# Patient Record
Sex: Female | Born: 1975 | Race: White | Hispanic: No | Marital: Single | Smoking: Former smoker
Health system: Southern US, Community
[De-identification: ages and names within clinical notes are randomized; demographics above are authoritative.]

## PROBLEM LIST (undated history)

## (undated) DIAGNOSIS — J45909 Unspecified asthma, uncomplicated: Secondary | ICD-10-CM

## (undated) DIAGNOSIS — J309 Allergic rhinitis, unspecified: Secondary | ICD-10-CM

## (undated) HISTORY — PX: CHOLECYSTECTOMY: SHX55

## (undated) HISTORY — DX: Allergic rhinitis, unspecified: J30.9

## (undated) HISTORY — PX: NASAL SINUS SURGERY: SHX719

## (undated) HISTORY — PX: NASAL SEPTUM SURGERY: SHX37

## (undated) HISTORY — DX: Unspecified asthma, uncomplicated: J45.909

---

## 2004-11-20 ENCOUNTER — Inpatient Hospital Stay (HOSPITAL_COMMUNITY): Admission: AD | Admit: 2004-11-20 | Discharge: 2004-11-22 | Payer: Self-pay | Admitting: General Surgery

## 2011-06-15 ENCOUNTER — Other Ambulatory Visit (HOSPITAL_COMMUNITY): Payer: Self-pay | Admitting: Obstetrics and Gynecology

## 2011-06-15 DIAGNOSIS — N971 Female infertility of tubal origin: Secondary | ICD-10-CM

## 2011-07-07 ENCOUNTER — Ambulatory Visit (INDEPENDENT_AMBULATORY_CARE_PROVIDER_SITE_OTHER): Payer: PRIVATE HEALTH INSURANCE | Admitting: Family Medicine

## 2011-07-07 VITALS — BP 120/76 | HR 78 | Temp 98.0°F | Resp 16 | Ht 66.0 in | Wt 211.0 lb

## 2011-07-07 DIAGNOSIS — R059 Cough, unspecified: Secondary | ICD-10-CM

## 2011-07-07 DIAGNOSIS — J0110 Acute frontal sinusitis, unspecified: Secondary | ICD-10-CM

## 2011-07-07 DIAGNOSIS — J45901 Unspecified asthma with (acute) exacerbation: Secondary | ICD-10-CM

## 2011-07-07 DIAGNOSIS — R05 Cough: Secondary | ICD-10-CM

## 2011-07-07 DIAGNOSIS — J329 Chronic sinusitis, unspecified: Secondary | ICD-10-CM

## 2011-07-07 DIAGNOSIS — J302 Other seasonal allergic rhinitis: Secondary | ICD-10-CM

## 2011-07-07 MED ORDER — ALBUTEROL SULFATE HFA 108 (90 BASE) MCG/ACT IN AERS: 2.0000 | INHALATION_SPRAY | Freq: Four times a day (QID) | RESPIRATORY_TRACT | Status: DC | PRN

## 2011-07-07 MED ORDER — PREDNISONE 20 MG PO TABS: ORAL_TABLET | ORAL | Status: DC

## 2011-07-07 MED ORDER — FLUTICASONE PROPIONATE 50 MCG/ACT NA SUSP: 2.0000 | Freq: Every day | NASAL | Status: DC

## 2011-07-07 MED ORDER — AZITHROMYCIN 250 MG PO TABS: ORAL_TABLET | ORAL | Status: AC

## 2011-07-07 MED ORDER — HYDROCODONE-HOMATROPINE 5-1.5 MG/5ML PO SYRP: 5.0000 mL | ORAL_SOLUTION | Freq: Three times a day (TID) | ORAL | Status: AC | PRN

## 2011-07-07 NOTE — Progress Notes (Signed)
  Subjective:    Patient ID: Chelsea Bryant, female    DOB: 1976/01/10, 36 y.o.   MRN: [id number]  Cough This is a new problem. The current episode started 1 to 4 weeks ago. The problem has been gradually worsening.   Cough worse at night; rarely productive of purulent sputum Wheezing  Advair BID  Albuterol daily  Denies fever or chills; Denies facial or dental pain  Quit smoking 1 year ago Review of Systems  Respiratory: Positive for cough.    PMH/ Esure placed 2/24; Dr. Renaldo Fiddler    Objective:   Physical Exam  Constitutional: She appears well-developed.  HENT:  Right Ear: External ear normal.  Left Ear: External ear normal.  Nose: Mucosal edema and rhinorrhea present.  Neck: Neck supple.  Cardiovascular: Normal rate, regular rhythm and normal heart sounds.   Pulmonary/Chest: She has wheezes (wheezing anterior chest).  Lymphadenopathy:    She has cervical adenopathy.  Skin: Skin is warm.          Assessment & Plan:   1. Sinusitis  azithromycin (ZITHROMAX) 250 MG tablet, HYDROcodone-homatropine (HYCODAN) 5-1.5 MG/5ML syrup  2. Asthma  albuterol (PROVENTIL HFA;VENTOLIN HFA) 108 (90 BASE) MCG/ACT inhaler, predniSONE (DELTASONE) 20 MG tablet  3. Seasonal allergies  fluticasone (FLONASE) 50 MCG/ACT nasal spray

## 2011-07-20 ENCOUNTER — Ambulatory Visit (INDEPENDENT_AMBULATORY_CARE_PROVIDER_SITE_OTHER): Payer: PRIVATE HEALTH INSURANCE | Admitting: Family Medicine

## 2011-07-20 ENCOUNTER — Ambulatory Visit: Payer: PRIVATE HEALTH INSURANCE

## 2011-07-20 VITALS — BP 127/79 | HR 93 | Resp 20

## 2011-07-20 DIAGNOSIS — J45909 Unspecified asthma, uncomplicated: Secondary | ICD-10-CM

## 2011-07-20 DIAGNOSIS — R062 Wheezing: Secondary | ICD-10-CM

## 2011-07-20 DIAGNOSIS — R05 Cough: Secondary | ICD-10-CM

## 2011-07-20 DIAGNOSIS — R059 Cough, unspecified: Secondary | ICD-10-CM

## 2011-07-20 MED ORDER — CEFDINIR 300 MG PO CAPS: 300.0000 mg | ORAL_CAPSULE | Freq: Two times a day (BID) | ORAL | Status: AC

## 2011-07-20 MED ORDER — PREDNISONE 20 MG PO TABS: ORAL_TABLET | ORAL | Status: DC

## 2011-07-20 NOTE — Progress Notes (Signed)
Patient Name: Chelsea Bryant Date of Birth: November 09, 1975 Medical Record Number: [id number] Gender: female Date of Encounter: 07/20/2011  History of Present Illness:  Chelsea Bryant is a 36 y.o. very pleasant female patient who presents with the following:  See OV 07/07/11- she was treated with azithro and prednisone and started on flonase for cough and asthma/ AR exacerbation.  Kim felt that she got better, but then her cough got worse a week ago again. Last night her cough got a lot worse- caused her to" lose her breath".  She felt that her "throat would close" when she coughed.  However, cough is non- productive.  Felt like she was going to "cough up a lung."  She was able to get her cough under control and eventually went to sleep.  She has noted a lot of wheezing lately.   No vomiting. No fever She really feels ok except she is coughing so badly. Also she noted that she broke out in a rash just one her chest about one week ago- unsure if it could be due to the azithromycin or not.  It is not pruritic.     She was brought back urgently and treated with an albuterol/ atrovent neb today.  The neb made her breathing feel better but did make her jittery as well  She has had essure procedure for pregnancy prevention.   tdap 2007  There is no problem list on file for this patient.  No past medical history on file. No past surgical history on file. History  Substance Use Topics  . Smoking status: Former Games developer  . Smokeless tobacco: Not on file  . Alcohol Use: Not on file   No family history on file. Allergies  Allergen Reactions  . Metronidazole Hives    Medication list has been reviewed and updated.  Review of Systems: As per HPI- otherwise negative. Also fine but non- itchy rash on her chest only for the last 5 days  Physical Examination: Filed Vitals:   07/20/11 0812  BP: 127/79  Pulse: 93  Resp: 20  SpO2: 98%    There is no height or weight on file to calculate  BMI.  GEN: WDWN, NAD, Non-toxic, A & O x 3 HEENT: Atraumatic, Normocephalic. Neck supple. No masses, No LAD. Ears and Nose: No external deformity. CV: RRR, No M/G/R. No JVD. No thrill. No extra heart sounds. PULM: good air movement bilaterally- there are expiratory wheezes.   After neb treatment breathing much quieter- no respiratory distress or retractions EXTR: No c/c/e NEURO Normal gait.  PSYCH: Normally interactive. Conversant. Not depressed or anxious appearing.  Calm demeanor.  Fine rash on chest only  [facility] reading (PRIMARY) by  Dr. Patsy Lager.  Negative chest  Clinical Data: Persistent cough 1 month.  CHEST - 2 VIEW  Comparison: None.  Findings: Right lung is clear. There is focal opacity at the left hemidiaphragm, compatible with atelectasis or infiltrate. The lungs are clear without focal consolidation, edema, effusion or pneumothorax. Cardiopericardial silhouette is within normal limits for size. Imaged bony structures of the thorax are intact. Imaged bony structures of the thorax are intact.  IMPRESSION: Small focus of atelectasis or infiltrate at the left base.  Assessment and Plan: 1. Cough  DG Chest 2 View, Bordetella pertussis antibody, cefdinir (OMNICEF) 300 MG capsule  2. Wheeze  DG Chest 2 View, Bordetella pertussis antibody  3. Asthma  predniSONE (DELTASONE) 20 MG tablet   Will test for pertussis due to severity and duration  of cough.  Will cover with omnicef in response to possible pneumonia- also prednisone for severe cough and asthma exacerbation.  Rash may be related to azithromycin, or may be unrelated.  However, no sign of severe allergic reaction.  Please call us if not feeling better in the next few days.  If she get "scared" due to SOB and severe cough as she did last night- don't hesitate to seek care at ED if needed.

## 2011-07-21 ENCOUNTER — Encounter: Payer: Self-pay | Admitting: Family Medicine

## 2011-07-21 MED ORDER — ALBUTEROL SULFATE (2.5 MG/3ML) 0.083% IN NEBU
2.5000 mg | INHALATION_SOLUTION | RESPIRATORY_TRACT | Status: DC | PRN
  Administered 2012-06-30: 2.5 mg via RESPIRATORY_TRACT

## 2011-07-21 MED ORDER — IPRATROPIUM BROMIDE 0.02 % IN SOLN
0.5000 mg | RESPIRATORY_TRACT | Status: DC | PRN
  Administered 2012-06-30: 0.5 mg via RESPIRATORY_TRACT

## 2011-07-21 NOTE — Progress Notes (Signed)
Addended by: Abbe Amsterdam C on: 07/21/2011 01:57 PM   Modules accepted: Orders

## 2011-07-24 LAB — BORDETELLA PERTUSSIS ANTIBODY
B pertussis IgA Ab, Quant: 2.5 U/mL — ABNORMAL HIGH (ref <=1.1)
B pertussis IgG Ab, Quant: 8.7 U/mL — ABNORMAL HIGH (ref <=2.4)
B pertussis IgM Ab, Quant: 1 U/mL (ref <=1.1)

## 2011-07-25 LAB — BORDETELLA PERTUSSIS, IGA BY IB

## 2011-07-25 LAB — BORDETELLA PERTUSSIS, IGG BY IB (RFLX)

## 2011-08-06 ENCOUNTER — Encounter: Payer: Self-pay | Admitting: Family Medicine

## 2011-09-10 ENCOUNTER — Ambulatory Visit (HOSPITAL_COMMUNITY): Payer: PRIVATE HEALTH INSURANCE

## 2011-09-14 ENCOUNTER — Ambulatory Visit (INDEPENDENT_AMBULATORY_CARE_PROVIDER_SITE_OTHER): Payer: PRIVATE HEALTH INSURANCE | Admitting: Family Medicine

## 2011-09-14 ENCOUNTER — Ambulatory Visit: Payer: PRIVATE HEALTH INSURANCE

## 2011-09-14 VITALS — BP 121/72 | HR 77 | Temp 98.4°F | Resp 18 | Ht 66.5 in | Wt 214.0 lb

## 2011-09-14 DIAGNOSIS — J9801 Acute bronchospasm: Secondary | ICD-10-CM

## 2011-09-14 DIAGNOSIS — R05 Cough: Secondary | ICD-10-CM

## 2011-09-14 DIAGNOSIS — R062 Wheezing: Secondary | ICD-10-CM

## 2011-09-14 DIAGNOSIS — Z23 Encounter for immunization: Secondary | ICD-10-CM

## 2011-09-14 DIAGNOSIS — R059 Cough, unspecified: Secondary | ICD-10-CM

## 2011-09-14 DIAGNOSIS — J40 Bronchitis, not specified as acute or chronic: Secondary | ICD-10-CM

## 2011-09-14 MED ORDER — HYDROCODONE-HOMATROPINE 5-1.5 MG/5ML PO SYRP: ORAL_SOLUTION | ORAL | Status: AC

## 2011-09-14 MED ORDER — PREDNISONE 20 MG PO TABS: ORAL_TABLET | ORAL | Status: AC

## 2011-09-14 MED ORDER — IPRATROPIUM BROMIDE 0.02 % IN SOLN
0.5000 mg | Freq: Once | RESPIRATORY_TRACT | Status: AC
  Administered 2011-09-14: 0.5 mg via RESPIRATORY_TRACT

## 2011-09-14 MED ORDER — ALBUTEROL SULFATE (2.5 MG/3ML) 0.083% IN NEBU
2.5000 mg | INHALATION_SOLUTION | Freq: Once | RESPIRATORY_TRACT | Status: AC
  Administered 2011-09-14: 2.5 mg via RESPIRATORY_TRACT

## 2011-09-14 MED ORDER — AZITHROMYCIN 250 MG PO TABS: ORAL_TABLET | ORAL | Status: AC

## 2011-09-14 MED ORDER — TETANUS-DIPHTH-ACELL PERTUSSIS 5-2.5-18.5 LF-MCG/0.5 IM SUSP
0.5000 mL | Freq: Once | INTRAMUSCULAR | Status: AC
  Administered 2011-09-14: 0.5 mL via INTRAMUSCULAR

## 2011-09-14 MED ORDER — BENZONATATE 100 MG PO CAPS: 100.0000 mg | ORAL_CAPSULE | Freq: Three times a day (TID) | ORAL | Status: AC | PRN

## 2011-09-14 NOTE — Progress Notes (Signed)
Patient ID: Chelsea Bryant MRN: [id number], DOB: Jun 27, 1975, 36 y.o. Date of Encounter: 09/14/2011, 6:14 PM  Primary Physician: No primary provider on file.  Chief Complaint:  Chief Complaint  Patient presents with  . Cough    x 3 days    HPI: 36 y.o. year old female with recent bout of whooping cough several months prior presents with a 3 day history of mild nasal congestion, rhinorrhea, and cough. Afebrile. No chills. Began as cough with rhinorrhea that has now transitioned into mild nasal congestion. Cough is nonproductive and not associated with time of day. Will get into coughing fits that cause some wheezing and SOB. Has been using her albuterol inhaler with minimal success. No GI complaints. Appetite decreased slightly. Has not yet gotten her TDaP. Does not need a refill of her albuterol inhaler.  No sick contacts, recent antibiotics, or recent travels.   No leg trauma, sedentary periods, h/o cancer, or tobacco use.  Past Medical History  Diagnosis Date  . Whooping cough      Home Meds: Prior to Admission medications   Medication Sig Start Date End Date Taking? Authorizing Provider  albuterol (PROVENTIL HFA;VENTOLIN HFA) 108 (90 BASE) MCG/ACT inhaler Inhale 2 puffs into the lungs every 6 (six) hours as needed. 07/07/11  Yes Dois Davenport, MD  escitalopram (LEXAPRO) 10 MG tablet Take 10 mg by mouth daily.   Yes Historical Provider, MD  fluticasone (FLONASE) 50 MCG/ACT nasal spray Place 2 sprays into the nose daily. 07/07/11 07/06/12 Yes Dois Davenport, MD  fluticasone-salmeterol (ADVAIR HFA) [phone] MCG/ACT inhaler Inhale 2 puffs into the lungs 2 (two) times daily.    Historical Provider, MD  predniSONE (DELTASONE) 20 MG tablet 2 daily for 3 days then 1 daily for 3 days 07/20/11  No Pearline Cables, MD    Allergies:  Allergies  Allergen Reactions  . Metronidazole Hives    History   Social History  . Marital Status: Single    Spouse Name: N/A    Number of  Children: N/A  . Years of Education: N/A   Occupational History  . Not on file.   Social History Main Topics  . Smoking status: Former Games developer  . Smokeless tobacco: Not on file  . Alcohol Use: Not on file  . Drug Use: Not on file  . Sexually Active: Not on file   Other Topics Concern  . Not on file   Social History Narrative  . No narrative on file     Review of Systems: Constitutional: negative for chills, fever, night sweats or weight changes Cardiovascular: negative for chest pain or palpitations Respiratory: negative for hemoptysis Abdominal: negative for abdominal pain, nausea, vomiting or diarrhea Dermatological: negative for rash Neurologic: negative for headache   Physical Exam: Blood pressure 121/72, pulse 77, temperature 98.4 F (36.9 C), temperature source Oral, resp. rate 18, height 5' 6.5" (1.689 m), weight 214 lb (97.07 kg)., Body mass index is 34.02 kg/(m^2). General: Well developed, well nourished, in no acute distress. Head: Normocephalic, atraumatic, eyes without discharge, sclera non-icteric, nares are congested. Bilateral auditory canals clear, TM's are without perforation, pearly grey with reflective cone of light bilaterally. No sinus TTP. Oral cavity moist, dentition normal. Posterior pharynx with post nasal drip and mild erythema. No peritonsillar abscess or tonsillar exudate. Neck: Supple. No thyromegaly. Full ROM. No lymphadenopathy. Lungs: Mild expiratory wheezes bilaterally without rales or rhonchi. Breathing is unlabored.  Heart: RRR with S1 S2. No murmurs, rubs, or gallops  appreciated. Msk:  Strength and tone normal for age. Extremities: No clubbing or cyanosis. No edema. Neuro: Alert and oriented X 3. Moves all extremities spontaneously. CNII-XII grossly in tact. Psych:  Responds to questions appropriately with a normal affect.   [facility] reading (PRIMARY) by  Dr. Patsy Lager. negative  S/P Albuterol and Atrovent neb: Patient feels much better.  Decreased wheezing to auscultation  ASSESSMENT AND PLAN:  36 y.o. year old female with bronchitis worsened by RAD. -Azithromycin 250 MG #6 2 po first day then 1 po next 4 days no RF -Hycodan #4oz 1 tsp po q 4-6 hours prn cough no RF SED -Tessalon Perles 100 mg 1 po tid prn cough #30 no RF  -TDaP -Mucinex -Tylenol/Motrin prn -Rest/fluids -RTC precautions -RTC 3-5 days if no improvement  Elinor Dodge, PA-C 09/14/2011 6:14 PM

## 2012-02-02 ENCOUNTER — Other Ambulatory Visit: Payer: Self-pay | Admitting: Family Medicine

## 2012-02-02 ENCOUNTER — Telehealth: Payer: Self-pay | Admitting: *Deleted

## 2012-02-02 MED ORDER — FLUTICASONE-SALMETEROL 45-21 MCG/ACT IN AERO: 1.0000 | INHALATION_SPRAY | Freq: Two times a day (BID) | RESPIRATORY_TRACT | Status: DC

## 2012-02-02 MED ORDER — ALBUTEROL SULFATE HFA 108 (90 BASE) MCG/ACT IN AERS: 2.0000 | INHALATION_SPRAY | Freq: Four times a day (QID) | RESPIRATORY_TRACT | Status: DC | PRN

## 2012-02-02 NOTE — Telephone Encounter (Signed)
Needs rx for ProAir.   Chart is at nurses station for refill on it for Avair from pharmacy

## 2012-02-02 NOTE — Telephone Encounter (Signed)
I will refill each once. She will need an office visit before she is out. We will not be able to refill them any further without an office visit.

## 2012-06-30 ENCOUNTER — Ambulatory Visit (INDEPENDENT_AMBULATORY_CARE_PROVIDER_SITE_OTHER): Payer: Managed Care, Other (non HMO) | Admitting: Emergency Medicine

## 2012-06-30 ENCOUNTER — Ambulatory Visit: Payer: Managed Care, Other (non HMO)

## 2012-06-30 VITALS — BP 119/79 | HR 78 | Temp 98.2°F | Resp 16 | Ht 67.0 in | Wt 222.6 lb

## 2012-06-30 DIAGNOSIS — R062 Wheezing: Secondary | ICD-10-CM

## 2012-06-30 DIAGNOSIS — J45909 Unspecified asthma, uncomplicated: Secondary | ICD-10-CM

## 2012-06-30 DIAGNOSIS — R059 Cough, unspecified: Secondary | ICD-10-CM

## 2012-06-30 DIAGNOSIS — R05 Cough: Secondary | ICD-10-CM

## 2012-06-30 MED ORDER — IPRATROPIUM BROMIDE 0.02 % IN SOLN: 0.5000 mg | Freq: Once | RESPIRATORY_TRACT | Status: DC

## 2012-06-30 MED ORDER — ALBUTEROL SULFATE (2.5 MG/3ML) 0.083% IN NEBU: 2.5000 mg | INHALATION_SOLUTION | Freq: Once | RESPIRATORY_TRACT | Status: DC

## 2012-06-30 MED ORDER — PREDNISONE 20 MG PO TABS: ORAL_TABLET | ORAL | Status: DC

## 2012-06-30 MED ORDER — FLUTICASONE-SALMETEROL 500-50 MCG/DOSE IN AEPB: 1.0000 | INHALATION_SPRAY | Freq: Two times a day (BID) | RESPIRATORY_TRACT | Status: DC

## 2012-06-30 MED ORDER — AZITHROMYCIN 250 MG PO TABS: ORAL_TABLET | ORAL | Status: DC

## 2012-06-30 NOTE — Progress Notes (Signed)
  Subjective:    Patient ID: Chelsea Bryant, female    DOB: [birth year], 37 y.o.   MRN: [id number]  HPI 37 year old female presents with acute asthma exacerbation. States she was working outside 2 days ago and her allergies flared causing her to have worsening wheezing, chest tightness, and SOB.  Also complains of cough that is dry and nonproductive.  Has had minimal nasal congestion. Denies sore throat, sinus pain, otalgia, nausea, vomiting, or abdominal pain.  Has been without her Advair for about 2 weeks secondary to cost.  Her current insurance plan does not have any prescription benefits and the Advair cost about $200.  She has been using her ProAir more frequently than she usually does and it has helped slightly but symptoms have persisted.     Review of Systems  Constitutional: Negative for fever and chills.  HENT: Positive for rhinorrhea. Negative for congestion, sore throat and postnasal drip.   Respiratory: Positive for cough, chest tightness, shortness of breath and wheezing.   Cardiovascular: Negative for chest pain.  Gastrointestinal: Negative for nausea, vomiting and abdominal pain.  Allergic/Immunologic: Positive for environmental allergies.       Objective:   Physical Exam  Constitutional: She is oriented to person, place, and time. She appears well-developed and well-nourished.  HENT:  Head: Normocephalic and atraumatic.  Right Ear: Hearing, tympanic membrane, external ear and ear canal normal.  Left Ear: Hearing, tympanic membrane, external ear and ear canal normal.  Mouth/Throat: Uvula is midline, oropharynx is clear and moist and mucous membranes are normal. No oropharyngeal exudate.  Eyes: Conjunctivae are normal.  Neck: Normal range of motion.  Cardiovascular: Normal rate, regular rhythm and normal heart sounds.   Pulmonary/Chest: Effort normal. She has wheezes.  Wheezing improved s/p duoneb  Lymphadenopathy:    She has no cervical adenopathy.  Neurological:  She is alert and oriented to person, place, and time.  Psychiatric: She has a normal mood and affect. Her behavior is normal. Judgment and thought content normal.    Patient reports improvement in symptoms s/p nebulizer.   [facility] reading (PRIMARY) by  Dr. Cleta Alberts as no acute infiltrate or consolidation noted.       Assessment & Plan:  1. Wheezing - Plan: albuterol (PROVENTIL) (2.5 MG/3ML) 0.083% nebulizer solution 2.5 mg, ipratropium (ATROVENT) nebulizer solution 0.5 mg, DG Chest 2 View, predniSONE (DELTASONE) 20 MG tablet  -Continue ProAir q4-6 hours as needed for wheezing and SOB  -I have given her a coupon for Advair, hopefully this will help out with the cost  -Prednisone taper 2. Cough - Plan: azithromycin (ZITHROMAX) 250 MG tablet  -Will go ahead and cover with a Zpack due to increased risk of bronchitis/pneumonia.  3. Reactive airway disease  -See above  -Follow up if symptoms worsen or fail to improve.

## 2012-10-28 ENCOUNTER — Telehealth: Payer: Self-pay

## 2012-10-28 MED ORDER — BECLOMETHASONE DIPROPIONATE 80 MCG/ACT IN AERS: 2.0000 | INHALATION_SPRAY | Freq: Two times a day (BID) | RESPIRATORY_TRACT | Status: DC

## 2012-10-28 NOTE — Telephone Encounter (Signed)
Chelsea Bryant - wants to ask some questions about her asthma medication. [phone]

## 2012-10-28 NOTE — Telephone Encounter (Signed)
She is out of medication and she cannot afford the Advair. She is wanting to know if there is another medication she can take that would be cost effective. The Advair costs 500.00 without insurance.

## 2012-10-28 NOTE — Telephone Encounter (Signed)
Tell Chelsea Bryant that she can switch to another combination product (Symbicort or Dulera), though it may be as expensive. Another alternative would be a steroid alone (budesonide, Qvar, Flovent, Pulmicort).  I'm not sure which is the cheapest, but I think it's Qvar, so I've sent that in for her. If she's still needing the rescue inhaler more than 3x/week, or more than 3 nights/month, she needs to get checked.

## 2012-10-28 NOTE — Telephone Encounter (Signed)
Patient notified and will try the Qvar. She will return for recheck if she does not improve with needing the rescue inhaler.

## 2013-04-22 ENCOUNTER — Telehealth: Payer: Self-pay

## 2013-04-22 NOTE — Telephone Encounter (Signed)
Pt requesting her Pro-Air refill called into a different pharmacy this time.   Pharmacy [facility] [place] [address]   Phone [phone]

## 2013-04-23 MED ORDER — ALBUTEROL SULFATE HFA 108 (90 BASE) MCG/ACT IN AERS: 2.0000 | INHALATION_SPRAY | Freq: Four times a day (QID) | RESPIRATORY_TRACT | Status: DC | PRN

## 2013-04-23 NOTE — Telephone Encounter (Signed)
Pended please advise.  

## 2013-04-23 NOTE — Telephone Encounter (Signed)
SEnt to pharmacy  

## 2013-05-03 ENCOUNTER — Ambulatory Visit (INDEPENDENT_AMBULATORY_CARE_PROVIDER_SITE_OTHER): Payer: Self-pay | Admitting: Emergency Medicine

## 2013-05-03 VITALS — BP 110/60 | HR 93 | Temp 98.7°F | Resp 16 | Ht 66.0 in | Wt 194.0 lb

## 2013-05-03 DIAGNOSIS — J020 Streptococcal pharyngitis: Secondary | ICD-10-CM

## 2013-05-03 DIAGNOSIS — J029 Acute pharyngitis, unspecified: Secondary | ICD-10-CM

## 2013-05-03 LAB — POCT RAPID STREP A (OFFICE): Rapid Strep A Screen: POSITIVE — AB

## 2013-05-03 MED ORDER — PENICILLIN V POTASSIUM 500 MG PO TABS: 500.0000 mg | ORAL_TABLET | Freq: Four times a day (QID) | ORAL | Status: DC

## 2013-05-03 MED ORDER — HYDROCODONE-ACETAMINOPHEN 5-325 MG PO TABS: 1.0000 | ORAL_TABLET | ORAL | Status: DC | PRN

## 2013-05-03 NOTE — Patient Instructions (Signed)
Strep Throat  Strep throat is an infection of the throat caused by a bacteria named Streptococcus pyogenes. Your caregiver may call the infection streptococcal "tonsillitis" or "pharyngitis" depending on whether there are signs of inflammation in the tonsils or back of the throat. Strep throat is most common in children aged 39 15 years during the cold months of the year, but it can occur in people of any age during any season. This infection is spread from person to person (contagious) through coughing, sneezing, or other close contact.  SYMPTOMS   · Fever or chills.  · Painful, swollen, red tonsils or throat.  · Pain or difficulty when swallowing.  · White or yellow spots on the tonsils or throat.  · Swollen, tender lymph nodes or "glands" of the neck or under the jaw.  · Red rash all over the body (rare).  DIAGNOSIS   Many different infections can cause the same symptoms. A test must be done to confirm the diagnosis so the right treatment can be given. A "rapid strep test" can help your caregiver make the diagnosis in a few minutes. If this test is not available, a light swab of the infected area can be used for a throat culture test. If a throat culture test is done, results are usually available in a day or two.  TREATMENT   Strep throat is treated with antibiotic medicine.  HOME CARE INSTRUCTIONS   · Gargle with 1 tsp of salt in 1 cup of warm water, 3 4 times per day or as needed for comfort.  · Family members who also have a sore throat or fever should be tested for strep throat and treated with antibiotics if they have the strep infection.  · Make sure everyone in your household washes their hands well.  · Do not share food, drinking cups, or personal items that could cause the infection to spread to others.  · You may need to eat a soft food diet until your sore throat gets better.  · Drink enough water and fluids to keep your urine clear or pale yellow. This will help prevent dehydration.  · Get plenty of  rest.  · Stay home from school, daycare, or work until you have been on antibiotics for 24 hours.  · Only take over-the-counter or prescription medicines for pain, discomfort, or fever as directed by your caregiver.  · If antibiotics are prescribed, take them as directed. Finish them even if you start to feel better.  SEEK MEDICAL CARE IF:   · The glands in your neck continue to enlarge.  · You develop a rash, cough, or earache.  · You cough up green, yellow-brown, or bloody sputum.  · You have pain or discomfort not controlled by medicines.  · Your problems seem to be getting worse rather than better.  SEEK IMMEDIATE MEDICAL CARE IF:   · You develop any new symptoms such as vomiting, severe headache, stiff or painful neck, chest pain, shortness of breath, or trouble swallowing.  · You develop severe throat pain, drooling, or changes in your voice.  · You develop swelling of the neck, or the skin on the neck becomes red and tender.  · You have a fever.  · You develop signs of dehydration, such as fatigue, dry mouth, and decreased urination.  · You become increasingly sleepy, or you cannot wake up completely.  Document Released: 04/20/2000 Document Revised: 04/09/2012 Document Reviewed: 06/22/2010  Exit[employer]® Patient Information ©2014 [employer].

## 2013-05-03 NOTE — Progress Notes (Signed)
[facility] [address], [place] [address] [phone]  Date:  05/03/2013   Name:  YUVAL NOLET   DOB:  06/02/1975   MRN:  [id number]  PCP:  No primary provider on file.    Chief Complaint: Sore Throat and Dysphagia   History of Present Illness:  Chelsea Bryant is a 37 y.o. very pleasant female patient who presents with the following:  Ill since Friday with sore throat.  Now hoarse.  Has no coryza.  Fever and chills.  No cough, wheezing or shortness of breath.  Has malaise and arthralgias and myalgias.  No improvement with over the counter medications or other home remedies. Denies other complaint or health concern today.   There are no active problems to display for this patient.   Past Medical History  Diagnosis Date  . Whooping cough   . Asthma     Past Surgical History  Procedure Laterality Date  . Cholecystectomy      History  Substance Use Topics  . Smoking status: Former Games developer  . Smokeless tobacco: Not on file  . Alcohol Use: No    Family History  Problem Relation Age of Onset  . Hypertension Mother   . Hyperlipidemia Mother     Allergies  Allergen Reactions  . Metronidazole Hives    Medication list has been reviewed and updated.  Current Outpatient Prescriptions on File Prior to Visit  Medication Sig Dispense Refill  . albuterol (PROAIR HFA) 108 (90 BASE) MCG/ACT inhaler Inhale 2 puffs into the lungs every 6 (six) hours as needed for wheezing.  8.5 each  0  . beclomethasone (QVAR) 80 MCG/ACT inhaler Inhale 2 puffs into the lungs 2 (two) times daily.  1 Inhaler  12  . escitalopram (LEXAPRO) 10 MG tablet Take 10 mg by mouth daily.      . fluticasone (FLONASE) 50 MCG/ACT nasal spray Place 2 sprays into the nose daily.  1 g  6  . fluticasone-salmeterol (ADVAIR HFA) 45-21 MCG/ACT inhaler Inhale 1 puff into the lungs 2 (two) times daily.  1 Inhaler  0   Current Facility-Administered Medications on File Prior to Visit   Medication Dose Route Frequency Provider Last Rate Last Dose  . albuterol (PROVENTIL) (2.5 MG/3ML) 0.083% nebulizer solution 2.5 mg  2.5 mg Nebulization Q4H PRN Gwenlyn Found Copland, MD   2.5 mg at 06/30/12 1206  . albuterol (PROVENTIL) (2.5 MG/3ML) 0.083% nebulizer solution 2.5 mg  2.5 mg Nebulization Once Heather M Marte, PA-C      . ipratropium (ATROVENT) nebulizer solution 0.5 mg  0.5 mg Nebulization Q4H PRN Gwenlyn Found Copland, MD   0.5 mg at 06/30/12 1207  . ipratropium (ATROVENT) nebulizer solution 0.5 mg  0.5 mg Nebulization Once Nelva Nay, PA-C        Review of Systems:  As per HPI, otherwise negative.    Physical Examination: Filed Vitals:   05/03/13 1125  BP: 110/60  Pulse: 93  Temp: 98.7 F (37.1 C)  Resp: 16   Filed Vitals:   05/03/13 1125  Height: 5\' 6"  (1.676 m)  Weight: 194 lb (87.998 kg)   Body mass index is 31.33 kg/(m^2). Ideal Body Weight: Weight in (lb) to have BMI = 25: 154.6  GEN: WDWN, NAD, Non-toxic, A & O x 3 HEENT: Atraumatic, Normocephalic. Neck supple. No masses, moderate tender anterior LAD.  Erythematous phayrnx Ears and Nose: No external deformity. CV: RRR, No M/G/R. No JVD. No thrill. No  extra heart sounds. PULM: CTA B, no wheezes, crackles, rhonchi. No retractions. No resp. distress. No accessory muscle use. ABD: S, NT, ND, +BS. No rebound. No HSM. EXTR: No c/c/e NEURO Normal gait.  PSYCH: Normally interactive. Conversant. Not depressed or anxious appearing.  Calm demeanor.    Assessment and Plan: Strep Pen vk vicodin  Signed,  Phillips Odor, MD   Results for orders placed in visit on 05/03/13  POCT RAPID STREP A (OFFICE)      Result Value Range   Rapid Strep A Screen Positive (*) Negative

## 2013-06-08 ENCOUNTER — Ambulatory Visit: Payer: Managed Care, Other (non HMO) | Admitting: Family Medicine

## 2013-06-08 ENCOUNTER — Ambulatory Visit: Payer: Managed Care, Other (non HMO)

## 2013-06-08 VITALS — BP 120/68 | HR 65 | Temp 98.0°F | Resp 16 | Ht 67.0 in | Wt 197.0 lb

## 2013-06-08 DIAGNOSIS — M542 Cervicalgia: Secondary | ICD-10-CM

## 2013-06-08 DIAGNOSIS — M546 Pain in thoracic spine: Secondary | ICD-10-CM

## 2013-06-08 MED ORDER — CYCLOBENZAPRINE HCL 5 MG PO TABS: 5.0000 mg | ORAL_TABLET | Freq: Every day | ORAL | Status: DC

## 2013-06-08 MED ORDER — DICLOFENAC SODIUM 75 MG PO TBEC: 75.0000 mg | DELAYED_RELEASE_TABLET | Freq: Two times a day (BID) | ORAL | Status: DC
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
## 2013-08-02 ENCOUNTER — Ambulatory Visit (INDEPENDENT_AMBULATORY_CARE_PROVIDER_SITE_OTHER): Payer: Managed Care, Other (non HMO) | Admitting: Internal Medicine

## 2013-08-02 ENCOUNTER — Ambulatory Visit: Payer: Managed Care, Other (non HMO)

## 2013-08-02 VITALS — BP 108/64 | HR 67 | Temp 98.2°F | Resp 16 | Ht 66.0 in | Wt 196.0 lb

## 2013-08-02 DIAGNOSIS — R2241 Localized swelling, mass and lump, right lower limb: Secondary | ICD-10-CM

## 2013-08-02 DIAGNOSIS — R29898 Other symptoms and signs involving the musculoskeletal system: Secondary | ICD-10-CM

## 2013-08-02 NOTE — Progress Notes (Signed)
   Subjective:    Patient ID: Chelsea Bryant, female    DOB: 01-01-1976, 38 y.o.   MRN: [id number]  HPI C/o swelling below R knee since 11/14 that is progressively larger and now painful after she sits with legs crossed or bent for a while. No trauma. No redness.  No recent illnesses or problems  Review of Systems Noncontributory    Objective:   Physical Exam BP 108/64  Pulse 67  Temp(Src) 98.2 F (36.8 C)  Resp 16  Ht 5\' 6"  (1.676 m)  Wt 196 lb (88.905 kg)  BMI 31.65 kg/m2  SpO2 99% 3 cm round movable nontender slightly fluctuant mass just below the right patella on the medial aspect of the anterior tibial tuberosity Knee has a full range of motion without laxity  [facility] reading (PRIMARY) by  Dr. Merla Richesoolittle= there are no bony lesions       Assessment & Plan:  Soft tissues cyst in the area of the right knee  We'll refer to orthopedics to consider further diagnosis and potential treatment

## 2013-09-06 ENCOUNTER — Ambulatory Visit (INDEPENDENT_AMBULATORY_CARE_PROVIDER_SITE_OTHER): Payer: Managed Care, Other (non HMO) | Admitting: Family Medicine

## 2013-09-06 VITALS — BP 120/70 | HR 90 | Temp 98.1°F | Ht 66.0 in | Wt 195.0 lb

## 2013-09-06 DIAGNOSIS — J45909 Unspecified asthma, uncomplicated: Secondary | ICD-10-CM

## 2013-09-06 MED ORDER — FLUTICASONE FUROATE 100 MCG/ACT IN AEPB: 100.0000 ug | INHALATION_SPRAY | Freq: Every day | RESPIRATORY_TRACT | Status: DC

## 2013-09-06 MED ORDER — ALBUTEROL SULFATE (2.5 MG/3ML) 0.083% IN NEBU
2.5000 mg | INHALATION_SOLUTION | Freq: Once | RESPIRATORY_TRACT | Status: AC
  Administered 2013-09-06: 2.5 mg via RESPIRATORY_TRACT

## 2013-09-06 MED ORDER — METHYLPREDNISOLONE ACETATE 80 MG/ML IJ SUSP
120.0000 mg | Freq: Once | INTRAMUSCULAR | Status: AC
Start: 2013-09-06 — End: 2013-09-06
  Administered 2013-09-06: 120 mg via INTRAMUSCULAR

## 2013-09-06 MED ORDER — IPRATROPIUM BROMIDE 0.02 % IN SOLN
0.5000 mg | Freq: Once | RESPIRATORY_TRACT | Status: AC
  Administered 2013-09-06: 0.5 mg via RESPIRATORY_TRACT

## 2013-09-06 NOTE — Patient Instructions (Signed)

## 2013-09-06 NOTE — Progress Notes (Addendum)
° °  Subjective:    Patient ID: Chelsea Bryant, female    DOB: [birth year], 38 y.o.   MRN: [id number]  HPI No chief complaint on file.  This chart was scribed for Elvina SidleKurt Lauenstein, MD by Andrew Auaven Small, ED Scribe. This patient was seen in room 7 and the patient's care was started at 10:42 AM.  HPI Comments: Chelsea Bryant is a 38 y.o. female who presents to the [facility] complaining of asthma flare up onset this morning. Pt reports that she is wheezing. Pt denies having a nebulizer at home.  Pt denies fever. Pt reports she was in ICU 4 months ago. Pt reports she is about to take steroids.   Past Medical History  Diagnosis Date   Whooping cough    Asthma    Allergies  Allergen Reactions   Metronidazole Hives   Prior to Admission medications   Medication Sig Start Date End Date Taking? Authorizing Provider  albuterol (PROAIR HFA) 108 (90 BASE) MCG/ACT inhaler Inhale 2 puffs into the lungs every 6 (six) hours as needed for wheezing. 04/23/13  Yes Eleanore Delia ChimesE Egan, PA-C  beclomethasone (QVAR) 80 MCG/ACT inhaler Inhale 2 puffs into the lungs 2 (two) times daily. 10/28/12  Yes Chelle S Jeffery, PA-C  cyclobenzaprine (FLEXERIL) 5 MG tablet Take 1 tablet (5 mg total) by mouth at bedtime. 06/08/13  Yes Elvina SidleKurt Lauenstein, MD  diclofenac (VOLTAREN) 75 MG EC tablet Take 1 tablet (75 mg total) by mouth 2 (two) times daily. 06/08/13  Yes Elvina SidleKurt Lauenstein, MD  fluticasone-salmeterol (ADVAIR HFA) [phone] MCG/ACT inhaler Inhale 1 puff into the lungs 2 (two) times daily. 02/02/12  Yes Ryan M Dunn, PA-C  HYDROcodone-acetaminophen (NORCO) 5-325 MG per tablet Take 1-2 tablets by mouth every 4 (four) hours as needed for moderate pain. 05/03/13  Yes Phillips OdorJeffery Anderson, MD  penicillin v potassium (VEETID) 500 MG tablet Take 1 tablet (500 mg total) by mouth 4 (four) times daily. 05/03/13  Yes Phillips OdorJeffery Anderson, MD  escitalopram (LEXAPRO) 10 MG tablet Take 10 mg by mouth daily.    Historical Provider,  MD  fluticasone (FLONASE) 50 MCG/ACT nasal spray Place 2 sprays into the nose daily. 07/07/11 07/06/12  Dois DavenportKaren L Richter, MD    Review of Systems  Constitutional: Negative for fever.  Respiratory: Positive for wheezing.        Objective:   Physical Exam Filed Vitals:   09/06/13 1022  BP: 120/70  Pulse: 90  Temp: 98.1 F (36.7 C)   patient is in mild to moderate respiratory distress. She's is able to communicate and is articulate and cooperative HEENT: Unremarkable Neck: Soft supple no adenopathy or thyromegaly Chest: Diffuse rhonchi and wheezes on inspiration and expiration. There are some bibasilar rales as well. Heart: Regular no murmur or gallop Extremities: No edema Skin: Warm and dry  Following initial evaluation, patient was given nebulizer treatment. She showed very significant improvement in air movement after the Depo-Medrol and nebulizer.    Assessment & Plan:   Asthma - Plan: methylPREDNISolone acetate (DEPO-MEDROL) injection 120 mg, Fluticasone Furoate (ARNUITY ELLIPTA) 100 MCG/ACT AEPB  Signed, Elvina SidleKurt Lauenstein, MD

## 2013-09-06 NOTE — Progress Notes (Signed)
lbpcmh   

## 2013-09-06 NOTE — Addendum Note (Signed)
Addended by: Bonnee QuinWITHERSPOON, Neomia Herbel J on: 09/06/2013 11:31 AM   Modules accepted: Orders

## 2014-04-20 ENCOUNTER — Other Ambulatory Visit: Payer: Self-pay

## 2014-04-20 MED ORDER — ALBUTEROL SULFATE HFA 108 (90 BASE) MCG/ACT IN AERS: 2.0000 | INHALATION_SPRAY | Freq: Four times a day (QID) | RESPIRATORY_TRACT | Status: DC | PRN

## 2014-11-11 ENCOUNTER — Other Ambulatory Visit: Payer: Self-pay | Admitting: Family Medicine

## 2014-12-15 ENCOUNTER — Other Ambulatory Visit: Payer: Self-pay | Admitting: Family Medicine

## 2015-02-10 ENCOUNTER — Emergency Department (HOSPITAL_COMMUNITY): Payer: PRIVATE HEALTH INSURANCE

## 2015-02-10 ENCOUNTER — Emergency Department (HOSPITAL_COMMUNITY)
Admission: EM | Admit: 2015-02-10 | Discharge: 2015-02-10 | Disposition: A | Discharge status: Home / Self Care | Payer: PRIVATE HEALTH INSURANCE | Attending: Emergency Medicine | Admitting: Emergency Medicine

## 2015-02-10 ENCOUNTER — Encounter (HOSPITAL_COMMUNITY): Payer: Self-pay | Admitting: Emergency Medicine

## 2015-02-10 DIAGNOSIS — Z792 Long term (current) use of antibiotics: Secondary | ICD-10-CM | POA: Insufficient documentation

## 2015-02-10 DIAGNOSIS — Z79899 Other long term (current) drug therapy: Secondary | ICD-10-CM | POA: Insufficient documentation

## 2015-02-10 DIAGNOSIS — J45901 Unspecified asthma with (acute) exacerbation: Secondary | ICD-10-CM | POA: Insufficient documentation

## 2015-02-10 DIAGNOSIS — R062 Wheezing: Secondary | ICD-10-CM

## 2015-02-10 DIAGNOSIS — L509 Urticaria, unspecified: Secondary | ICD-10-CM | POA: Insufficient documentation

## 2015-02-10 DIAGNOSIS — Z87891 Personal history of nicotine dependence: Secondary | ICD-10-CM | POA: Insufficient documentation

## 2015-02-10 DIAGNOSIS — R911 Solitary pulmonary nodule: Secondary | ICD-10-CM

## 2015-02-10 DIAGNOSIS — Z7951 Long term (current) use of inhaled steroids: Secondary | ICD-10-CM | POA: Insufficient documentation

## 2015-02-10 MED ORDER — ALBUTEROL SULFATE (2.5 MG/3ML) 0.083% IN NEBU
5.0000 mg | INHALATION_SOLUTION | Freq: Once | RESPIRATORY_TRACT | Status: AC
  Administered 2015-02-10: 5 mg via RESPIRATORY_TRACT
  Filled 2015-02-10: qty 6

## 2015-02-10 MED ORDER — PREDNISONE 50 MG PO TABS: ORAL_TABLET | ORAL | Status: DC

## 2015-02-10 MED ORDER — PREDNISONE 20 MG PO TABS
60.0000 mg | ORAL_TABLET | Freq: Once | ORAL | Status: AC
  Administered 2015-02-10: 60 mg via ORAL
  Filled 2015-02-10: qty 3

## 2015-02-10 MED ORDER — EPINEPHRINE 0.3 MG/0.3ML IJ SOAJ: 0.3000 mg | Freq: Once | INTRAMUSCULAR | Status: DC

## 2015-02-10 MED ORDER — DIPHENHYDRAMINE HCL 25 MG PO CAPS
25.0000 mg | ORAL_CAPSULE | Freq: Once | ORAL | Status: AC
  Administered 2015-02-10: 25 mg via ORAL
  Filled 2015-02-10: qty 1

## 2015-02-10 NOTE — ED Provider Notes (Signed)
CSN: [id number]     Arrival date & time 02/10/15  0001 History  By signing my name below, I, Freida Busman, attest that this documentation has been prepared under the direction and in the presence of Zadie Rhine, MD . Electronically Signed: Freida Busman, Scribe. 02/10/2015. 1:00 AM.    Chief Complaint  Patient presents with  . Urticaria  . Chest Pain    Patient is a 39 y.o. female presenting with urticaria and chest pain. The history is provided by the patient. No language interpreter was used.  Urticaria This is a new problem. The current episode started 12 to 24 hours ago. The problem occurs constantly. The problem has not changed since onset.Associated symptoms include chest pain. Pertinent negatives include no abdominal pain, no headaches and no shortness of breath. Nothing aggravates the symptoms. Nothing relieves the symptoms.  Chest Pain Associated symptoms: no abdominal pain, no dysphagia, no fever, no headache and no shortness of breath     HPI Comments:  Chelsea Bryant is a 39 y.o. female who presents to the Emergency Department complaining of diffuse rash to her BLE, BUE and torso, onset ~ 1100 Am on 02/09/15. She reports associated pruritus to the site of the rash. Pt notes the only new thing she has eaten were some cookies that were purchased from store. She denies use of new soaps/detergents, recent insect bites,and denies h/o severe allergic reactions. She also denies SOB, fever, diarrhea, and tongue/throat edema. She has taken Benadryl with little relief; her last dose was ~ 1630 yesterday. Pt also complains of 5/10 central CP that she states feels like GERD. No alleviating factors noted.   Past Medical History  Diagnosis Date  . Whooping cough   . Asthma    Past Surgical History  Procedure Laterality Date  . Cholecystectomy     Family History  Problem Relation Age of Onset  . Hypertension Mother   . Hyperlipidemia Mother    Social History  Substance Use  Topics  . Smoking status: Former Games developer  . Smokeless tobacco: None  . Alcohol Use: No   OB History    No data available     Review of Systems  Constitutional: Negative for fever and chills.  HENT: Negative for sore throat and trouble swallowing.   Respiratory: Negative for shortness of breath.   Cardiovascular: Positive for chest pain.  Gastrointestinal: Negative for abdominal pain and diarrhea.  Neurological: Negative for headaches.  All other systems reviewed and are negative.  Allergies  Metronidazole  Home Medications   Prior to Admission medications   Medication Sig Start Date End Date Taking? Authorizing Provider  beclomethasone (QVAR) 80 MCG/ACT inhaler Inhale 2 puffs into the lungs 2 (two) times daily. 10/28/12   Chelle Jeffery, PA-C  cyclobenzaprine (FLEXERIL) 5 MG tablet Take 1 tablet (5 mg total) by mouth at bedtime. 06/08/13   Elvina Sidle, MD  diclofenac (VOLTAREN) 75 MG EC tablet Take 1 tablet (75 mg total) by mouth 2 (two) times daily. 06/08/13   Elvina Sidle, MD  escitalopram (LEXAPRO) 10 MG tablet Take 10 mg by mouth daily.    Historical Provider, MD  fluticasone (FLONASE) 50 MCG/ACT nasal spray Place 2 sprays into the nose daily. 07/07/11 07/06/12  Dois Davenport, MD  Fluticasone Furoate (ARNUITY ELLIPTA) 100 MCG/ACT AEPB Inhale 100 mcg into the lungs daily at 2 PM daily at 2 PM. "OV NEEDED FOR REFILLS" 11/12/14   Morrell Riddle, PA-C  fluticasone-salmeterol (ADVAIR HFA) [phone] MCG/ACT inhaler Inhale  1 puff into the lungs 2 (two) times daily. 02/02/12   Sondra Barges, PA-C  HYDROcodone-acetaminophen (NORCO) 5-325 MG per tablet Take 1-2 tablets by mouth every 4 (four) hours as needed for moderate pain. 05/03/13   Carmelina Dane, MD  penicillin v potassium (VEETID) 500 MG tablet Take 1 tablet (500 mg total) by mouth 4 (four) times daily. 05/03/13   Carmelina Dane, MD  PROAIR HFA 108 (90 BASE) MCG/ACT inhaler INHALE 2 PUFFS INTO LUNGS EVERY 6 HOURS AS NEEDED-NEED  OFFICE VISIT FOR REFILLS 12/15/14   Elvina Sidle, MD   BP 137/75 mmHg  Pulse 96  Temp(Src) 98.2 F (36.8 C) (Oral)  Resp 18  SpO2 97%  LMP 01/25/2015 Physical Exam CONSTITUTIONAL: Well developed/well nourished HEAD: Normocephalic/atraumatic EYES: EOMI/PERRL ENMT: Mucous membranes moist; No angioedema NECK: supple no meningeal signs SPINE/BACK:entire spine nontender CV: S1/S2 noted, no murmurs/rubs/gallops noted LUNGS: Scattered wheezing bilaterally, no apparent distress ABDOMEN: soft, nontender, no rebound or guarding, bowel sounds noted throughout abdomen GU:no cva tenderness NEURO: Pt is awake/alert/appropriate, moves all extremitiesx4.  No facial droop.   EXTREMITIES: pulses normal/equal, full ROM SKIN: warm, color normal, urticaria to torso and extremities PSYCH: no abnormalities of mood noted, alert and oriented to situation  ED Course  Procedures  Medications  diphenhydrAMINE (BENADRYL) capsule 25 mg (25 mg Oral Given 02/10/15 0107)  predniSONE (DELTASONE) tablet 60 mg (60 mg Oral Given 02/10/15 0107)  albuterol (PROVENTIL) (2.5 MG/3ML) 0.083% nebulizer solution 5 mg (5 mg Nebulization Given 02/10/15 0107)    DIAGNOSTIC STUDIES:  Oxygen Saturation is 97% on RA, normal by my interpretation.    COORDINATION OF CARE:  12:58 AM Discussed treatment plan with pt at bedside and pt agreed to plan.  2:05 AM Pt with obvious urticaria Feels improved after meds She has h/o asthma with minimal wheeze, albuterol given No signs of anaphylaxis For CP, pt reports this was minor, and I doubt PE/ACS/Dissection at thist ime She was informed of nodular density on CXR And need for outpatient workup/CT imaging with PCP and she understands this  Imaging Review Dg Chest 2 View  02/10/2015   CLINICAL DATA:  Broke out in hives, acute onset.  Initial encounter.  EXAM: CHEST  2 VIEW  COMPARISON:  Chest radiograph performed 06/30/2012  FINDINGS: The lungs are well-aerated. There is  suggestion of a 1.5 cm nodular density at the left lung base. There is no evidence of pleural effusion or pneumothorax.  The heart is normal in size; the mediastinal contour is within normal limits. No acute osseous abnormalities are seen. Clips are noted within the right upper quadrant, reflecting prior cholecystectomy.  IMPRESSION: Suggestion of a 1.5 cm nodular density at the left lung base. Though this could simply reflect normal vasculature, a nodule cannot be excluded. Would recommend CT of the chest for further evaluation, on an elective nonemergent basis.   Electronically Signed   By: Roanna Raider M.D.   On: 02/10/2015 01:11   I have personally reviewed and evaluated these images results as part of my medical decision-making.   EKG Interpretation   Date/Time:  Thursday February 10 2015 00:18:17 EDT Ventricular Rate:  91 PR Interval:  142 QRS Duration: 80 QT Interval:  390 QTC Calculation: 479 R Axis:   78 Text Interpretation:  Normal sinus rhythm with sinus arrhythmia Normal ECG  artifact noted Confirmed by Bebe Shaggy  MD, Soha Thorup ([id number]) on 02/10/2015  12:30:00 AM      MDM   Final diagnoses:  Urticaria  Pulmonary nodule    Nursing notes including past medical history and social history reviewed and considered in documentation xrays/imaging reviewed by myself and considered during evaluation   I, Joya Gaskins, personally performed the services described in this documentation. All medical record entries made by the scribe were at my direction and in my presence.  I have reviewed the chart and discharge instructions and agree that the record reflects my personal performance and is accurate and complete. Joya Gaskins.  02/10/2015. 2:04 AM.       Zadie Rhine, MD 02/10/15 [phone]

## 2015-02-10 NOTE — ED Notes (Signed)
Pt. presents with generalized itchy rashes/hives onset this evening , denies fever , airway intact / respirations unlabored .

## 2015-03-03 ENCOUNTER — Encounter: Payer: Self-pay | Admitting: Physician Assistant

## 2015-03-03 ENCOUNTER — Telehealth: Payer: Self-pay | Admitting: Physician Assistant

## 2015-03-03 ENCOUNTER — Ambulatory Visit (INDEPENDENT_AMBULATORY_CARE_PROVIDER_SITE_OTHER): Payer: Managed Care, Other (non HMO)

## 2015-03-03 ENCOUNTER — Ambulatory Visit: Payer: BLUE CROSS/BLUE SHIELD | Admitting: Physician Assistant

## 2015-03-03 VITALS — BP 116/77 | HR 81 | Temp 97.9°F | Resp 16 | Wt 213.0 lb

## 2015-03-03 DIAGNOSIS — R938 Abnormal findings on diagnostic imaging of other specified body structures: Secondary | ICD-10-CM | POA: Diagnosis not present

## 2015-03-03 DIAGNOSIS — R9389 Abnormal findings on diagnostic imaging of other specified body structures: Secondary | ICD-10-CM

## 2015-03-03 DIAGNOSIS — J4540 Moderate persistent asthma, uncomplicated: Secondary | ICD-10-CM

## 2015-03-03 MED ORDER — FLUTICASONE-SALMETEROL 250-50 MCG/DOSE IN AEPB: 1.0000 | INHALATION_SPRAY | Freq: Two times a day (BID) | RESPIRATORY_TRACT | Status: DC

## 2015-03-03 NOTE — Telephone Encounter (Signed)
Let pt know her CXR today is normal.

## 2015-03-03 NOTE — Progress Notes (Signed)
   Chelsea Bryant  MRN: [id number] DOB: [birth year]  Subjective:  Pt presents to clinic for repeat CXR.  She was in the hospital on 10/6 with an allergic reaction and her CXR was abnormal and she was instructed to have a repeat CXR.  She is having no symptoms.  Her asthma is well controlled but her new medication is $80 a month with her insurance and she feels like her Advair worked better.  She uses albuterol prn and a inhaler lasts about 2.5 months.  Patient Active Problem List   Diagnosis Date Noted  . Asthma, moderate persistent, well-controlled 03/03/2015    Current Outpatient Prescriptions on File Prior to Visit  Medication Sig Dispense Refill  . fluticasone (FLONASE) 50 MCG/ACT nasal spray Place 2 sprays into the nose daily. 1 g 6   No current facility-administered medications on file prior to visit.    Allergies  Allergen Reactions  . Metronidazole Hives    Review of Systems  Constitutional: Negative for fever, chills, appetite change and unexpected weight change.  HENT: Negative.   Respiratory: Negative for cough, shortness of breath and wheezing.    Objective:  BP 116/77 mmHg  Pulse 81  Temp(Src) 97.9 F (36.6 C) (Oral)  Resp 16  Wt 213 lb (96.616 kg)  LMP 01/25/2015  Physical Exam  Constitutional: She is oriented to person, place, and time and well-developed, well-nourished, and in no distress.  HENT:  Head: Normocephalic and atraumatic.  Right Ear: Hearing and external ear normal.  Left Ear: Hearing and external ear normal.  Eyes: Conjunctivae are normal.  Neck: Normal range of motion.  Cardiovascular: Normal rate, regular rhythm and normal heart sounds.   No murmur heard. Pulmonary/Chest: Effort normal and breath sounds normal. She has no wheezes.  Neurological: She is alert and oriented to person, place, and time. Gait normal.  Skin: Skin is warm and dry.  Psychiatric: Mood, memory, affect and judgment normal.  Vitals reviewed.  [facility] reading  (PRIMARY) by  Dr. Dareen PianoAnderson. Please compare with 02/10/2015 xray with nodular density marked.    Assessment and Plan :  Asthma, moderate persistent, well-controlled - Plan: Fluticasone-Salmeterol (ADVAIR DISKUS) 250-50 MCG/DOSE AEPB - change back to Advair - recheck in 2 months to make sure she is on the correct dose depending on her albuterol use.  Abnormal CXR - Plan: DG Chest 2 View - wait for radiologist read and respond appropriately.  Benny LennertSarah Weber PA-C  [facility] 03/03/2015 4:45 PM

## 2015-03-08 ENCOUNTER — Ambulatory Visit: Payer: Self-pay | Admitting: Physician Assistant

## 2015-03-28 NOTE — Progress Notes (Signed)
  Medical screening examination/treatment/procedure(s) were performed by non-physician practitioner and as supervising physician I was immediately available for consultation/collaboration.     

## 2015-06-10 ENCOUNTER — Ambulatory Visit (INDEPENDENT_AMBULATORY_CARE_PROVIDER_SITE_OTHER): Payer: BLUE CROSS/BLUE SHIELD | Admitting: Physician Assistant

## 2015-06-10 ENCOUNTER — Other Ambulatory Visit: Payer: Self-pay | Admitting: Family Medicine

## 2015-06-10 VITALS — BP 110/70 | HR 84 | Temp 98.0°F | Resp 17 | Ht 70.0 in | Wt 212.0 lb

## 2015-06-10 DIAGNOSIS — R059 Cough, unspecified: Secondary | ICD-10-CM

## 2015-06-10 DIAGNOSIS — Z8709 Personal history of other diseases of the respiratory system: Secondary | ICD-10-CM | POA: Diagnosis not present

## 2015-06-10 DIAGNOSIS — R05 Cough: Secondary | ICD-10-CM | POA: Diagnosis not present

## 2015-06-10 LAB — POCT CBC
Granulocyte percent: 61.2 %G (ref 37–80)
HCT, POC: 38 % (ref 37.7–47.9)
Hemoglobin: 13.5 g/dL (ref 12.2–16.2)
Lymph, poc: 1.4 (ref 0.6–3.4)
MCH, POC: 29.9 pg (ref 27–31.2)
MCHC: 35.5 g/dL — AB (ref 31.8–35.4)
MCV: 84.3 fL (ref 80–97)
MID (cbc): 0.6 (ref 0–0.9)
MPV: 7.5 fL (ref 0–99.8)
POC Granulocyte: 3.2 (ref 2–6.9)
POC LYMPH PERCENT: 26.4 %L (ref 10–50)
POC MID %: 12.4 %M — AB (ref 0–12)
Platelet Count, POC: 177 10*3/uL (ref 142–424)
RBC: 4.5 M/uL (ref 4.04–5.48)
RDW, POC: 13.1 %
WBC: 5.2 10*3/uL (ref 4.6–10.2)

## 2015-06-10 LAB — GLUCOSE, POCT (MANUAL RESULT ENTRY): POC Glucose: 88 mg/dl (ref 70–99)

## 2015-06-10 MED ORDER — PREDNISONE 20 MG PO TABS: ORAL_TABLET | ORAL | Status: AC

## 2015-06-10 MED ORDER — IPRATROPIUM BROMIDE 0.02 % IN SOLN
0.5000 mg | Freq: Once | RESPIRATORY_TRACT | Status: AC
  Administered 2015-06-10: 0.5 mg via RESPIRATORY_TRACT

## 2015-06-10 MED ORDER — HYDROCODONE-HOMATROPINE 5-1.5 MG/5ML PO SYRP: 2.5000 mL | ORAL_SOLUTION | Freq: Every evening | ORAL | Status: DC | PRN

## 2015-06-10 MED ORDER — ALBUTEROL SULFATE (2.5 MG/3ML) 0.083% IN NEBU
2.5000 mg | INHALATION_SOLUTION | Freq: Once | RESPIRATORY_TRACT | Status: AC
  Administered 2015-06-10: 2.5 mg via RESPIRATORY_TRACT

## 2015-06-10 NOTE — Progress Notes (Signed)
06/10/2015 11:13 AM   DOB: 06/01/1975 / MRN: [id number]  SUBJECTIVE:  Chelsea Bryant is a 40 y.o. female presenting for for the evaluation of runny nose, congestion and cough that started 7 days ago.  Associated symptoms include chest tightness today and she denies fever and difficulty breathing. Treatments tried thus far include OTC preps without relief. She reports sick contacts. She has quit smoking and has a 15 pack year history and quit 10 years ago.  She has a history of asthma and takes advair.    She is allergic to metronidazole.   She  has a past medical history of Whooping cough and Asthma.    She  reports that she has quit smoking. She does not have any smokeless tobacco history on file. She reports that she does not drink alcohol or use illicit drugs. She  reports that she currently engages in sexual activity. She reports using the following method of birth control/protection: None. The patient  has past surgical history that includes Cholecystectomy.  Her family history includes Hyperlipidemia in her mother; Hypertension in her mother.  Review of Systems  Constitutional: Positive for malaise/fatigue. Negative for fever, chills and diaphoresis.  HENT: Positive for congestion. Negative for sore throat.   Respiratory: Positive for cough and wheezing. Negative for hemoptysis and shortness of breath.   Cardiovascular: Negative for chest pain.  Gastrointestinal: Negative for nausea.  Skin: Negative for rash.  Neurological: Negative for dizziness and weakness.  Endo/Heme/Allergies: Negative for polydipsia.    Problem list and medications reviewed and updated by myself where necessary, and exist elsewhere in the encounter.   OBJECTIVE:  BP 110/70 mmHg  Pulse 84  Temp(Src) 98 F (36.7 C) (Oral)  Resp 17  Ht  (1.778 m)  Wt 212 lb (96.163 kg)  BMI 30.42 kg/m2  SpO2 98%  LMP 05/28/2015  Physical Exam  Constitutional: She is oriented to person, place, and time. She  appears well-developed.  Eyes: EOM are normal. Pupils are equal, round, and reactive to light.  Cardiovascular: Normal rate.   Pulmonary/Chest: Effort normal.  Abdominal: She exhibits no distension.  Musculoskeletal: Normal range of motion.  Neurological: She is alert and oriented to person, place, and time. No cranial nerve deficit.  Skin: Skin is warm and dry. She is not diaphoretic.  Psychiatric: She has a normal mood and affect.  Vitals reviewed.   Results for orders placed or performed in visit on 06/10/15 (from the past 48 hour(s))  POCT CBC     Status: Abnormal   Collection Time: 06/10/15 11:08 AM  Result Value Ref Range   WBC 5.2 4.6 - 10.2 K/uL   Lymph, poc 1.4 0.6 - 3.4   POC LYMPH PERCENT 26.4 10 - 50 %L   MID (cbc) 0.6 0 - 0.9   POC MID % 12.4 (A) 0 - 12 %M   POC Granulocyte 3.2 2 - 6.9   Granulocyte percent 61.2 37 - 80 %G   RBC 4.50 4.04 - 5.48 M/uL   Hemoglobin 13.5 12.2 - 16.2 g/dL   HCT, POC 96.2 95.2 - 47.9 %   MCV 84.3 80 - 97 fL   MCH, POC 29.9 27 - 31.2 pg   MCHC 35.5 (A) 31.8 - 35.4 g/dL   RDW, POC 84.1 %   Platelet Count, POC 177 142 - 424 K/uL   MPV 7.5 0 - 99.8 fL     ASSESSMENT AND PLAN:  Chelsea Bryant was seen today for nasal congestion and  chest tightness.  Diagnoses and all orders for this visit:  History of asthma: -     POCT CBC -     albuterol (PROVENTIL) (2.5 MG/3ML) 0.083% nebulizer solution 2.5 mg; Take 3 mLs (2.5 mg total) by nebulization once. -     ipratropium (ATROVENT) nebulizer solution 0.5 mg; Take 2.5 mLs (0.5 mg total) by nebulization once. -     POCT glucose (manual entry)  Cough: Most likely viral etiology and now making problem 1 flare.  Will treat with PO steroids and see her back in 24 hours if worsening.  She is to take today off.   -     POCT glucose (manual entry)  Other orders -     predniSONE (DELTASONE) 20 MG tablet; Take 3 in the morning for 3 days, then 2 in the morning for 3 days, and then 1 in the morning for 3  days. -     HYDROcodone-homatropine (HYCODAN) 5-1.5 MG/5ML syrup; Take 2.5-5 mLs by mouth at bedtime as needed.    The patient was advised to call or return to clinic if she does not see an improvement in symptoms or to seek the care of the closest emergency department if she worsens with the above plan.   Deliah Boston, MHS, PA-C [facility] 06/10/2015 11:13 AM

## 2016-04-11 ENCOUNTER — Ambulatory Visit (INDEPENDENT_AMBULATORY_CARE_PROVIDER_SITE_OTHER): Payer: BLUE CROSS/BLUE SHIELD | Admitting: Physician Assistant

## 2016-04-11 ENCOUNTER — Encounter: Payer: Self-pay | Admitting: Physician Assistant

## 2016-04-11 VITALS — BP 110/70 | HR 67 | Temp 98.3°F | Resp 16 | Ht 66.0 in | Wt 212.0 lb

## 2016-04-11 DIAGNOSIS — M722 Plantar fascial fibromatosis: Secondary | ICD-10-CM | POA: Diagnosis not present

## 2016-04-11 DIAGNOSIS — M79671 Pain in right foot: Secondary | ICD-10-CM

## 2016-04-11 DIAGNOSIS — Z833 Family history of diabetes mellitus: Secondary | ICD-10-CM | POA: Diagnosis not present

## 2016-04-11 DIAGNOSIS — M79672 Pain in left foot: Secondary | ICD-10-CM | POA: Diagnosis not present

## 2016-04-11 LAB — GLUCOSE, POCT (MANUAL RESULT ENTRY): POC Glucose: 85 mg/dl (ref 70–99)

## 2016-04-11 MED ORDER — MELOXICAM 7.5 MG PO TABS: 7.5000 mg | ORAL_TABLET | Freq: Every day | ORAL | 0 refills | Status: DC

## 2016-04-11 NOTE — Patient Instructions (Addendum)
Plantar Fasciitis Plantar fasciitis is a painful foot condition that affects the heel. It occurs when the band of tissue that connects the toes to the heel bone (plantar fascia) becomes irritated. This can happen after exercising too much or doing other repetitive activities (overuse injury). The pain from plantar fasciitis can range from mild irritation to severe pain that makes it difficult for you to walk or move. The pain is usually worse in the morning or after you have been sitting or lying down for a while. CAUSES This condition may be caused by:  Standing for long periods of time.  Wearing shoes that do not fit.  Doing high-impact activities, including running, aerobics, and ballet.  Being overweight.  Having an abnormal way of walking (gait).  Having tight calf muscles.  Having high arches in your feet.  Starting a new athletic activity. SYMPTOMS The main symptom of this condition is heel pain. Other symptoms include:  Pain that gets worse after activity or exercise.  Pain that is worse in the morning or after resting.  Pain that goes away after you walk for a few minutes. DIAGNOSIS This condition may be diagnosed based on your signs and symptoms. Your health care provider will also do a physical exam to check for:  A tender area on the bottom of your foot.  A high arch in your foot.  Pain when you move your foot.  Difficulty moving your foot. You may also need to have imaging studies to confirm the diagnosis. These can include:  X-rays.  Ultrasound.  MRI. TREATMENT  Treatment for plantar fasciitis depends on the severity of the condition. Your treatment may include:  Rest, ice, and over-the-counter pain medicines to manage your pain.  Exercises to stretch your calves and your plantar fascia.  A splint that holds your foot in a stretched, upward position while you sleep (night splint).  Physical therapy to relieve symptoms and prevent problems in the  future.  Cortisone injections to relieve severe pain.  Extracorporeal shock wave therapy (ESWT) to stimulate damaged plantar fascia with electrical impulses. It is often used as a last resort before surgery.  Surgery, if other treatments have not worked after 12 months. HOME CARE INSTRUCTIONS  Take medicines only as directed by your health care provider.  Avoid activities that cause pain.  Roll the bottom of your foot over a bag of ice or a bottle of cold water. Do this for 20 minutes, 3-4 times a day.  Perform simple stretches as directed by your health care provider.  Try wearing athletic shoes with air-sole or gel-sole cushions or soft shoe inserts.  Wear a night splint while sleeping, if directed by your health care provider.  Keep all follow-up appointments with your health care provider. PREVENTION   Do not perform exercises or activities that cause heel pain.  Consider finding low-impact activities if you continue to have problems.  Lose weight if you need to. The best way to prevent plantar fasciitis is to avoid the activities that aggravate your plantar fascia. SEEK MEDICAL CARE IF:  Your symptoms do not go away after treatment with home care measures.  Your pain gets worse.  Your pain affects your ability to move or do your daily activities. This information is not intended to replace advice given to you by your health care provider. Make sure you discuss any questions you have with your health care provider. Document Released: 01/16/2001 Document Revised: 08/15/2015 Document Reviewed: 03/03/2014 Elsevier Interactive Patient Education  2017 Elsevier Inc.     IF you received an x-ray today, you will receive an invoice from [facility]. Please contact [facility] at [phone] with questions or concerns regarding your invoice.   IF you received labwork today, you will receive an invoice from [facility]. Please  contact [facility] at [phone] with questions or concerns regarding your invoice.   Our billing staff will not be able to assist you with questions regarding bills from these companies.  You will be contacted with the lab results as soon as they are available. The fastest way to get your results is to activate your My Chart account. Instructions are located on the last page of this paperwork. If you have not heard from us regarding the results in 2 weeks, please contact this office.

## 2016-04-11 NOTE — Progress Notes (Signed)
   Chelsea Bryant  MRN: [id number] DOB: 10/06/1975  Subjective:  Pt presents to clinic with 1.5 month ago - right foot pain - started at the heel - she was working about 80h a week and stands all the time with her past and current job - this am her left foot started to hurt.  She is having a hard time with walking due to the pain.  The pain is mainly at her heel and then will sometimes will go up to the front of her foot.  Review of Systems  Musculoskeletal: Positive for gait problem (2nd to pain).    Patient Active Problem List   Diagnosis Date Noted  . Asthma, moderate persistent, well-controlled 03/03/2015    Current Outpatient Prescriptions on File Prior to Visit  Medication Sig Dispense Refill  . Fluticasone-Salmeterol (ADVAIR DISKUS) 250-50 MCG/DOSE AEPB Inhale 1 puff into the lungs 2 (two) times daily. 3 each 1  . PROAIR HFA 108 (90 Base) MCG/ACT inhaler 2 PUFFS EVERY 6 HOURS AS NEEDED 8.5 Inhaler 2   No current facility-administered medications on file prior to visit.     Allergies  Allergen Reactions  . Metronidazole Hives    Pt patients past, family and social history were reviewed and updated.   Objective:  BP 110/70 (BP Location: Right Arm, Patient Position: Sitting, Cuff Size: Large)   Pulse 67   Temp 98.3 F (36.8 C) (Oral)   Resp 16   Ht 5\' 6"  (1.676 m)   Wt 212 lb (96.2 kg)   SpO2 100%   BMI 34.22 kg/m   Physical Exam  Constitutional: She is oriented to person, place, and time and well-developed, well-nourished, and in no distress.  HENT:  Head: Normocephalic and atraumatic.  Right Ear: Hearing and external ear normal.  Left Ear: Hearing and external ear normal.  Eyes: Conjunctivae are normal.  Neck: Normal range of motion.  Pulmonary/Chest: Effort normal.  Musculoskeletal:       Right ankle: Normal.       Left ankle: Normal.  Bilateral foot right>left - pain at heel - increased pain with dorsiflexion -   Neurological: She is alert and  oriented to person, place, and time. Gait normal.  Skin: Skin is warm and dry.  Psychiatric: Mood, memory, affect and judgment normal.  Vitals reviewed.  Results for orders placed or performed in visit on 04/11/16  POCT glucose (manual entry)  Result Value Ref Range   POC Glucose 85 70 - 99 mg/dl    Assessment and Plan :  Heel pain, bilateral - Plan: meloxicam (MOBIC) 7.5 MG tablet  Plantar fasciitis, bilateral - Plan: meloxicam (MOBIC) 7.5 MG tablet - stretches d/w pt - ice massage with frozen water bottle - she is to get gel heel cups to help with cushion - flat shoes are a short heel will be best - she should wear heels - she should stretch prior to standing up esp in the am to help reduce her pain - if this does not help we will consider PT for possible ionophoresis and gentle stretching to reduce her discomfort.  Family history of diabetes mellitus - Plan: POCT glucose (manual entry) - pt is worried about DM - her levels are normal today  Benny LennertSarah Aily Tzeng PA-C  [facility] 04/17/2016 6:03 PM

## 2016-04-27 ENCOUNTER — Other Ambulatory Visit: Payer: Self-pay | Admitting: Physician Assistant

## 2016-04-27 DIAGNOSIS — J4540 Moderate persistent asthma, uncomplicated: Secondary | ICD-10-CM

## 2016-04-28 ENCOUNTER — Other Ambulatory Visit: Payer: Self-pay | Admitting: *Deleted

## 2016-04-28 DIAGNOSIS — J4540 Moderate persistent asthma, uncomplicated: Secondary | ICD-10-CM

## 2016-04-28 MED ORDER — FLUTICASONE-SALMETEROL 250-50 MCG/DOSE IN AEPB
1.0000 | INHALATION_SPRAY | Freq: Two times a day (BID) | RESPIRATORY_TRACT | 0 refills | Status: DC
Start: 2016-04-28 — End: 2017-01-23

## 2016-08-24 ENCOUNTER — Other Ambulatory Visit: Payer: Self-pay | Admitting: Physician Assistant

## 2016-09-05 ENCOUNTER — Ambulatory Visit (INDEPENDENT_AMBULATORY_CARE_PROVIDER_SITE_OTHER): Payer: Commercial Managed Care - HMO | Admitting: Emergency Medicine

## 2016-09-05 ENCOUNTER — Encounter: Payer: Self-pay | Admitting: Emergency Medicine

## 2016-09-05 VITALS — BP 112/71 | HR 65 | Temp 98.2°F | Resp 17 | Ht 66.0 in | Wt 211.0 lb

## 2016-09-05 DIAGNOSIS — R05 Cough: Secondary | ICD-10-CM | POA: Diagnosis not present

## 2016-09-05 DIAGNOSIS — R059 Cough, unspecified: Secondary | ICD-10-CM

## 2016-09-05 DIAGNOSIS — R110 Nausea: Secondary | ICD-10-CM

## 2016-09-05 DIAGNOSIS — J4521 Mild intermittent asthma with (acute) exacerbation: Secondary | ICD-10-CM | POA: Diagnosis not present

## 2016-09-05 MED ORDER — BENZONATATE 200 MG PO CAPS: 200.0000 mg | ORAL_CAPSULE | Freq: Two times a day (BID) | ORAL | 0 refills | Status: DC | PRN

## 2016-09-05 MED ORDER — ONDANSETRON HCL 4 MG PO TABS: 4.0000 mg | ORAL_TABLET | Freq: Three times a day (TID) | ORAL | 0 refills | Status: DC | PRN

## 2016-09-05 MED ORDER — ONDANSETRON 4 MG PO TBDP
4.0000 mg | ORAL_TABLET | Freq: Once | ORAL | Status: AC
  Administered 2016-09-05: 4 mg via ORAL

## 2016-09-05 MED ORDER — PREDNISONE 20 MG PO TABS: 40.0000 mg | ORAL_TABLET | Freq: Every day | ORAL | 0 refills | Status: AC

## 2016-09-05 NOTE — Patient Instructions (Addendum)
IF you received an x-ray today, you will receive an invoice from [facility]. Please contact [facility] at [phone] with questions or concerns regarding your invoice.   IF you received labwork today, you will receive an invoice from [facility]. Please contact [facility] at [phone] with questions or concerns regarding your invoice.   Our billing staff will not be able to assist you with questions regarding bills from these companies.  You will be contacted with the lab results as soon as they are available. The fastest way to get your results is to activate your My Chart account. Instructions are located on the last page of this paperwork. If you have not heard from [place] regarding the results in 2 weeks, please contact this office.     Cough, Adult A cough helps to clear your throat and lungs. A cough may last only 2-3 weeks (acute), or it may last longer than 8 weeks (chronic). Many different things can cause a cough. A cough may be a sign of an illness or another medical condition. Follow these instructions at home:  Pay attention to any changes in your cough.  Take medicines only as told by your doctor.  If you were prescribed an antibiotic medicine, take it as told by your doctor. Do not stop taking it even if you start to feel better.  Talk with your doctor before you try using a cough medicine.  Drink enough fluid to keep your pee (urine) clear or pale yellow.  If the air is dry, use a cold steam vaporizer or humidifier in your home.  Stay away from things that make you cough at work or at home.  If your cough is worse at night, try using extra pillows to raise your head up higher while you sleep.  Do not smoke, and try not to be around smoke. If you need help quitting, ask your doctor.  Do not have caffeine.  Do not drink alcohol.  Rest as needed. Contact a doctor if:  You have new problems (symptoms).  You cough up yellow fluid  (pus).  Your cough does not get better after 2-3 weeks, or your cough gets worse.  Medicine does not help your cough and you are not sleeping well.  You have pain that gets worse or pain that is not helped with medicine.  You have a fever.  You are losing weight and you do not know why.  You have night sweats. Get help right away if:  You cough up blood.  You have trouble breathing.  Your heartbeat is very fast. This information is not intended to replace advice given to you by your health care provider. Make sure you discuss any questions you have with your health care provider. Document Released: 01/04/2011 Document Revised: 09/29/2015 Document Reviewed: 06/30/2014 Elsevier Interactive Patient Education  2017 Elsevier Inc.  Nausea, Adult Feeling sick to your stomach (nausea) means that your stomach is upset or you feel like you have to throw up (vomit). Feeling sick to your stomach is usually not serious, but it may be an early sign of a more serious medical problem. As you feel sicker to your stomach, it can lead to throwing up (vomiting). If you throw up, or if you are not able to drink enough fluids, there is a risk of dehydration. Dehydration can make you feel tired and thirsty, have a dry mouth, and pee (urinate) less often. Older adults and people who have other diseases or a weak  defense (immune) system have a higher risk of dehydration. The main goal of treating this condition is to:  Limit how often you feel sick to your stomach.  Prevent throwing up and dehydration. Follow these instructions at home: Follow instructions from your doctor about how to care for yourself at home. Eating and drinking  Follow these recommendations as told by your doctor:  Take an oral rehydration solution (ORS). This is a drink that is sold at pharmacies and stores.  Drink clear fluids in small amounts as you are able, such as:  Water.  Ice chips.  Fruit juice that has water added  (diluted fruit juice).  Low-calorie sports drinks.  Eat bland, easy to digest foods in small amounts as you are able, such as:  Bananas.  Applesauce.  Rice.  Lean meats.  Toast.  Crackers.  Avoid drinking fluids that contain a lot of sugar or caffeine.  Avoid alcohol.  Avoid spicy or fatty foods. General instructions   Drink enough fluid to keep your pee (urine) clear or pale yellow.  Wash your hands often. If you cannot use soap and water, use hand sanitizer.  Make sure that all people in your household wash their hands well and often.  Rest at home while you get better.  Take over-the-counter and prescription medicines only as told by your doctor.  Breathe slowly and deeply when you feel sick to your stomach.  Watch your condition for any changes.  Keep all follow-up visits as told by your doctor. This is important. Contact a doctor if:  You have a headache.  You have new symptoms.  You feel sicker to your stomach.  You have a fever.  You feel light-headed or dizzy.  You throw up.  You are not able to keep fluids down. Get help right away if:  You have pain in your chest, neck, arm, or jaw.  You feel very weak or you pass out (faint).  You have throw up that is bright red or looks like coffee grounds.  You have bloody or black poop (stools), or poop that looks like tar.  You have a very bad headache, a stiff neck, or both.  You have very bad pain, cramping, or bloating in your belly.  You have a rash.  You have trouble breathing or you are breathing very quickly.  Your heart is beating very quickly.  Your skin feels cold and clammy.  You feel confused.  You have pain while peeing.  You have signs of dehydration, such as:  Dark pee, or very little or no pee.  Cracked lips.  Dry mouth.  Sunken eyes.  Sleepiness.  Weakness. These symptoms may be an emergency. Do not wait to see if the symptoms will go away. Get medical help  right away. Call your local emergency services (911 in the [place] Do not drive yourself to the hospital. This information is not intended to replace advice given to you by your health care provider. Make sure you discuss any questions you have with your health care provider. Document Released: 04/12/2011 Document Revised: 09/29/2015 Document Reviewed: 12/28/2014 Elsevier Interactive Patient Education  2017 [employer].

## 2016-09-05 NOTE — Progress Notes (Signed)
Chelsea Bryant 41 y.o.   Chief Complaint  Patient presents with  . Cough    onset 3 days  . Emesis    onset today    HISTORY OF PRESENT ILLNESS: This is a 41 y.o. female complaining of dry cough since last Sunday, 3 days ago, followed today by nausea that started shortly after taking Hydrocodone cough syrup.  HPI   Prior to Admission medications   Medication Sig Start Date End Date Taking? Authorizing Provider  Fluticasone-Salmeterol (ADVAIR DISKUS) 250-50 MCG/DOSE AEPB Inhale 1 puff into the lungs 2 (two) times daily. 04/28/16  Yes Morrell Riddle, PA-C  meloxicam (MOBIC) 7.5 MG tablet Take 1-2 tablets (7.5-15 mg total) by mouth daily. 04/11/16  Yes Morrell Riddle, PA-C  PROAIR HFA 108 [phone] Base) MCG/ACT inhaler 2 PUFFS EVERY 6 HOURS AS NEEDED 08/25/16  Yes Ofilia Neas, PA-C    Allergies  Allergen Reactions  . Metronidazole Hives    Patient Active Problem List   Diagnosis Date Noted  . Asthma, moderate persistent, well-controlled 03/03/2015    Past Medical History:  Diagnosis Date  . Asthma   . Whooping cough     Past Surgical History:  Procedure Laterality Date  . CHOLECYSTECTOMY      Social History   Social History  . Marital status: Single    Spouse name: N/A  . Number of children: N/A  . Years of education: N/A   Occupational History  . Not on file.   Social History Main Topics  . Smoking status: Former Games developer  . Smokeless tobacco: Never Used  . Alcohol use No  . Drug use: No  . Sexual activity: Yes    Birth control/ protection: None   Other Topics Concern  . Not on file   Social History Narrative   Works - [employer]   In serious relationship   2 boys   1 granddaughter    Family History  Problem Relation Age of Onset  . Hypertension Mother   . Hyperlipidemia Mother      Review of Systems  Constitutional: Negative.  Negative for chills and fever.  HENT: Negative.  Negative for congestion, nosebleeds and sore throat.     Eyes: Negative.   Respiratory: Positive for cough and wheezing. Negative for hemoptysis, sputum production and shortness of breath.   Cardiovascular: Negative for chest pain, palpitations and leg swelling.  Gastrointestinal: Positive for nausea. Negative for abdominal pain and diarrhea.  Genitourinary: Negative.  Negative for dysuria and hematuria.  Musculoskeletal: Negative for back pain, myalgias and neck pain.  Skin: Negative.  Negative for rash.  Neurological: Negative.  Negative for dizziness and headaches.  Endo/Heme/Allergies: Negative.   All other systems reviewed and are negative.  Vitals:   09/05/16 1118  BP: 112/71  Pulse: 65  Resp: 17  Temp: 98.2 F (36.8 C)     Physical Exam  Constitutional: She is oriented to person, place, and time. She appears well-developed and well-nourished.  HENT:  Head: Normocephalic and atraumatic.  Mouth/Throat: Oropharynx is clear and moist. No oropharyngeal exudate.  Eyes: Conjunctivae and EOM are normal. Pupils are equal, round, and reactive to light.  Neck: Normal range of motion. Neck supple. No JVD present. No thyromegaly present.  Cardiovascular: Normal rate, regular rhythm and normal heart sounds.   Pulmonary/Chest: Effort normal. She has wheezes (mild and scattered).  Abdominal: Soft. Bowel sounds are normal. There is no tenderness.  Musculoskeletal: Normal range of motion. She exhibits no edema.  Lymphadenopathy:    She has no cervical adenopathy.  Neurological: She is alert and oriented to person, place, and time. No sensory deficit. She exhibits normal muscle tone.  Skin: Skin is warm and dry. Capillary refill takes less than 2 seconds. No rash noted.  Psychiatric: She has a normal mood and affect. Her behavior is normal.  Vitals reviewed.    ASSESSMENT & PLAN: Chelsea Bryant was seen today for cough and emesis.  Diagnoses and all orders for this visit:  Nausea without vomiting -     ondansetron (ZOFRAN-ODT) disintegrating  tablet 4 mg; Take 1 tablet (4 mg total) by mouth once. -     ondansetron (ZOFRAN) 4 MG tablet; Take 1 tablet (4 mg total) by mouth every 8 (eight) hours as needed for nausea or vomiting.  Cough -     benzonatate (TESSALON) 200 MG capsule; Take 1 capsule (200 mg total) by mouth 2 (two) times daily as needed for cough.  Mild intermittent asthma with acute exacerbation -     predniSONE (DELTASONE) 20 MG tablet; Take 2 tablets (40 mg total) by mouth daily with breakfast.    Patient Instructions       IF you received an x-ray today, you will receive an invoice from [facility]. Please contact [facility] at [phone] with questions or concerns regarding your invoice.   IF you received labwork today, you will receive an invoice from [facility]. Please contact [facility] at [phone] with questions or concerns regarding your invoice.   Our billing staff will not be able to assist you with questions regarding bills from these companies.  You will be contacted with the lab results as soon as they are available. The fastest way to get your results is to activate your My Chart account. Instructions are located on the last page of this paperwork. If you have not heard from [place] regarding the results in 2 weeks, please contact this office.     Cough, Adult A cough helps to clear your throat and lungs. A cough may last only 2-3 weeks (acute), or it may last longer than 8 weeks (chronic). Many different things can cause a cough. A cough may be a sign of an illness or another medical condition. Follow these instructions at home:  Pay attention to any changes in your cough.  Take medicines only as told by your doctor.  If you were prescribed an antibiotic medicine, take it as told by your doctor. Do not stop taking it even if you start to feel better.  Talk with your doctor before you try using a cough medicine.  Drink enough fluid to keep your pee (urine) clear or pale  yellow.  If the air is dry, use a cold steam vaporizer or humidifier in your home.  Stay away from things that make you cough at work or at home.  If your cough is worse at night, try using extra pillows to raise your head up higher while you sleep.  Do not smoke, and try not to be around smoke. If you need help quitting, ask your doctor.  Do not have caffeine.  Do not drink alcohol.  Rest as needed. Contact a doctor if:  You have new problems (symptoms).  You cough up yellow fluid (pus).  Your cough does not get better after 2-3 weeks, or your cough gets worse.  Medicine does not help your cough and you are not sleeping well.  You have pain that gets worse or pain that is  not helped with medicine.  You have a fever.  You are losing weight and you do not know why.  You have night sweats. Get help right away if:  You cough up blood.  You have trouble breathing.  Your heartbeat is very fast. This information is not intended to replace advice given to you by your health care provider. Make sure you discuss any questions you have with your health care provider. Document Released: 01/04/2011 Document Revised: 09/29/2015 Document Reviewed: 06/30/2014 Elsevier Interactive Patient Education  2017 Elsevier Inc.  Nausea, Adult Feeling sick to your stomach (nausea) means that your stomach is upset or you feel like you have to throw up (vomit). Feeling sick to your stomach is usually not serious, but it may be an early sign of a more serious medical problem. As you feel sicker to your stomach, it can lead to throwing up (vomiting). If you throw up, or if you are not able to drink enough fluids, there is a risk of dehydration. Dehydration can make you feel tired and thirsty, have a dry mouth, and pee (urinate) less often. Older adults and people who have other diseases or a weak defense (immune) system have a higher risk of dehydration. The main goal of treating this condition is  to:  Limit how often you feel sick to your stomach.  Prevent throwing up and dehydration. Follow these instructions at home: Follow instructions from your doctor about how to care for yourself at home. Eating and drinking  Follow these recommendations as told by your doctor:  Take an oral rehydration solution (ORS). This is a drink that is sold at pharmacies and stores.  Drink clear fluids in small amounts as you are able, such as:  Water.  Ice chips.  Fruit juice that has water added (diluted fruit juice).  Low-calorie sports drinks.  Eat bland, easy to digest foods in small amounts as you are able, such as:  Bananas.  Applesauce.  Rice.  Lean meats.  Toast.  Crackers.  Avoid drinking fluids that contain a lot of sugar or caffeine.  Avoid alcohol.  Avoid spicy or fatty foods. General instructions   Drink enough fluid to keep your pee (urine) clear or pale yellow.  Wash your hands often. If you cannot use soap and water, use hand sanitizer.  Make sure that all people in your household wash their hands well and often.  Rest at home while you get better.  Take over-the-counter and prescription medicines only as told by your doctor.  Breathe slowly and deeply when you feel sick to your stomach.  Watch your condition for any changes.  Keep all follow-up visits as told by your doctor. This is important. Contact a doctor if:  You have a headache.  You have new symptoms.  You feel sicker to your stomach.  You have a fever.  You feel light-headed or dizzy.  You throw up.  You are not able to keep fluids down. Get help right away if:  You have pain in your chest, neck, arm, or jaw.  You feel very weak or you pass out (faint).  You have throw up that is bright red or looks like coffee grounds.  You have bloody or black poop (stools), or poop that looks like tar.  You have a very bad headache, a stiff neck, or both.  You have very bad pain,  cramping, or bloating in your belly.  You have a rash.  You have trouble breathing or you  are breathing very quickly.  Your heart is beating very quickly.  Your skin feels cold and clammy.  You feel confused.  You have pain while peeing.  You have signs of dehydration, such as:  Dark pee, or very little or no pee.  Cracked lips.  Dry mouth.  Sunken eyes.  Sleepiness.  Weakness. These symptoms may be an emergency. Do not wait to see if the symptoms will go away. Get medical help right away. Call your local emergency services (911 in the [place] Do not drive yourself to the hospital. This information is not intended to replace advice given to you by your health care provider. Make sure you discuss any questions you have with your health care provider. Document Released: 04/12/2011 Document Revised: 09/29/2015 Document Reviewed: 12/28/2014 Elsevier Interactive Patient Education  2017 Elsevier Inc.      Edwina Barth, MD [facility]

## 2016-11-21 ENCOUNTER — Other Ambulatory Visit: Payer: Self-pay | Admitting: Physician Assistant

## 2016-11-21 DIAGNOSIS — J4540 Moderate persistent asthma, uncomplicated: Secondary | ICD-10-CM

## 2017-01-18 ENCOUNTER — Other Ambulatory Visit: Payer: Self-pay | Admitting: Physician Assistant

## 2017-01-18 DIAGNOSIS — J4540 Moderate persistent asthma, uncomplicated: Secondary | ICD-10-CM

## 2017-01-21 ENCOUNTER — Other Ambulatory Visit: Payer: Self-pay | Admitting: Family Medicine

## 2017-01-21 MED ORDER — ALBUTEROL SULFATE HFA 108 (90 BASE) MCG/ACT IN AERS: 2.0000 | INHALATION_SPRAY | Freq: Four times a day (QID) | RESPIRATORY_TRACT | 1 refills | Status: DC | PRN

## 2017-01-21 NOTE — Telephone Encounter (Signed)
Refilled and put in a note to the pharmacy that she must keep her appointment on 01/23/2017 to get further refills.

## 2017-01-23 ENCOUNTER — Ambulatory Visit (INDEPENDENT_AMBULATORY_CARE_PROVIDER_SITE_OTHER): Payer: BLUE CROSS/BLUE SHIELD | Admitting: Physician Assistant

## 2017-01-23 ENCOUNTER — Encounter: Payer: Self-pay | Admitting: Physician Assistant

## 2017-01-23 VITALS — BP 111/72 | HR 68 | Temp 98.7°F | Resp 18 | Ht 66.61 in | Wt 207.4 lb

## 2017-01-23 DIAGNOSIS — J4540 Moderate persistent asthma, uncomplicated: Secondary | ICD-10-CM

## 2017-01-23 DIAGNOSIS — R609 Edema, unspecified: Secondary | ICD-10-CM | POA: Diagnosis not present

## 2017-01-23 MED ORDER — FLUTICASONE-SALMETEROL 250-50 MCG/DOSE IN AEPB: 1.0000 | INHALATION_SPRAY | Freq: Every day | RESPIRATORY_TRACT | 1 refills | Status: DC

## 2017-01-23 MED ORDER — FLUTICASONE PROPIONATE 50 MCG/ACT NA SUSP
2.0000 | Freq: Every day | NASAL | 6 refills | Status: DC
Start: 2017-01-23 — End: 2017-03-05

## 2017-01-23 NOTE — Progress Notes (Signed)
LURDES HALTIWANGER  MRN: [id number] DOB: 1975-07-24  Subjective:  Chelsea Bryant is a 41 y.o. female seen in office today for a chief complaint of medication refill for advair. Has hx of asthma since at least 10 years ago. Dx by allergy and asthma specialist. Uses advair daily. Has been out of it for 5 days. Does endorse chest tightness and intermittent wheezing. Does not use albuterol inhaler much at all. Denies chest pain, SOB, and difficulty breathing. Has not had an exacerbation. Notes she also has some nasal congestion and sneezing. Has no known hx of seasonal allergies but was told by the ENT doctors that she had a deviated septum. Had been on flonase in the past and that did help.  Pt would like to see an allergy and asthma specialist again as she is wondering if her asthma is chronic or intermittent as she does not use advair daily and feels fine on some days. However if she is exposed to irritants like lysol or other scents,that is when her chest feels tight. She also wants new allergy testing because it has been ten years. She is former smoker.   Review of Systems  Constitutional: Negative for chills, diaphoresis, fever and unexpected weight change.  HENT: Negative for sinus pressure, sore throat and tinnitus.   Respiratory: Negative for cough.   Cardiovascular: Negative for chest pain and palpitations.    Patient Active Problem List   Diagnosis Date Noted  . Nausea without vomiting 09/05/2016  . Mild intermittent asthma with acute exacerbation 09/05/2016  . Cough 09/05/2016  . Asthma, moderate persistent, well-controlled 03/03/2015    Current Outpatient Prescriptions on File Prior to Visit  Medication Sig Dispense Refill  . albuterol (PROAIR HFA) 108 (90 Base) MCG/ACT inhaler Inhale 2 puffs into the lungs every 6 (six) hours as needed for wheezing or shortness of breath. 8.5 Inhaler 1  . Fluticasone-Salmeterol (ADVAIR DISKUS) 250-50 MCG/DOSE AEPB Inhale 1 puff into the  lungs 2 (two) times daily. 3 each 0  . benzonatate (TESSALON) 200 MG capsule Take 1 capsule (200 mg total) by mouth 2 (two) times daily as needed for cough. (Patient not taking: Reported on 01/23/2017) 20 capsule 0  . meloxicam (MOBIC) 7.5 MG tablet Take 1-2 tablets (7.5-15 mg total) by mouth daily. (Patient not taking: Reported on 01/23/2017) 60 tablet 0  . ondansetron (ZOFRAN) 4 MG tablet Take 1 tablet (4 mg total) by mouth every 8 (eight) hours as needed for nausea or vomiting. (Patient not taking: Reported on 01/23/2017) 20 tablet 0   No current facility-administered medications on file prior to visit.     Allergies  Allergen Reactions  . Metronidazole Hives     Objective:  BP 111/72 (BP Location: Right Arm, Patient Position: Sitting, Cuff Size: Large)   Pulse 68   Temp 98.7 F (37.1 C) (Oral)   Resp 18   Ht 5' 6.61" (1.692 m)   Wt 207 lb 6.4 oz (94.1 kg)   LMP 01/14/2017 (Approximate)   SpO2 97%   BMI 32.86 kg/m   Physical Exam  Constitutional: She is oriented to person, place, and time and well-developed, well-nourished, and in no distress.  HENT:  Head: Normocephalic and atraumatic.  Right Ear: Tympanic membrane, external ear and ear canal normal.  Left Ear: Tympanic membrane, external ear and ear canal normal.  Nose: Mucosal edema (severe on right, moderate on left) present. No rhinorrhea. Right sinus exhibits no maxillary sinus tenderness and no frontal sinus tenderness. Left sinus  exhibits no maxillary sinus tenderness and no frontal sinus tenderness.  Mouth/Throat: Uvula is midline, oropharynx is clear and moist and mucous membranes are normal.  Eyes: Conjunctivae are normal.  Neck: Normal range of motion.  Cardiovascular: Normal rate, regular rhythm and normal heart sounds.   Pulmonary/Chest: Effort normal and breath sounds normal. She has no wheezes. She has no rales.  Lymphadenopathy:       Head (right side): No submental, no submandibular, no tonsillar, no  preauricular, no posterior auricular and no occipital adenopathy present.       Head (left side): No submental, no submandibular, no tonsillar, no preauricular, no posterior auricular and no occipital adenopathy present.    She has no cervical adenopathy.       Right: No supraclavicular adenopathy present.       Left: No supraclavicular adenopathy present.  Neurological: She is alert and oriented to person, place, and time. Gait normal.  Skin: Skin is warm and dry.  Psychiatric: Affect normal.  Vitals reviewed.     Assessment and Plan :  1. Asthma, moderate persistent, well-controlled Will refer to asthma and allergy specialist at this time for further evaluation of pt's symptoms and diagnosis.  - Fluticasone-Salmeterol (ADVAIR DISKUS) 250-50 MCG/DOSE AEPB; Inhale 1 puff into the lungs daily.  Dispense: 60 each; Refill: 1 - Ambulatory referral to Allergy  2. Mucosal edema Will start flonase today for mucosal edema. Follow up with allergy and asthma as above.  - fluticasone (FLONASE) 50 MCG/ACT nasal spray; Place 2 sprays into both nostrils daily.  Dispense: 16 g; Refill: 6  Benjiman Core PA-C  Primary Care at [facility] 01/23/2017 8:30 AM

## 2017-01-23 NOTE — Patient Instructions (Addendum)
  While awaiting your allergy and asthma referral, please use Advair daily as prescribed. I would also like you to start using Flonase daily. It takes about 2 weeks to become fully effective. Please return if you have any questions. Thank you for letting me participate in your health and well being.    IF you received an x-ray today, you will receive an invoice from [facility]. Please contact [facility] at [phone] with questions or concerns regarding your invoice.   IF you received labwork today, you will receive an invoice from [place] [facility]. Please contact [facility] at [phone] with questions or concerns regarding your invoice.   Our billing staff will not be able to assist you with questions regarding bills from these companies.  You will be contacted with the lab results as soon as they are available. The fastest way to get your results is to activate your My Chart account. Instructions are located on the last page of this paperwork. If you have not heard from [place] regarding the results in 2 weeks, please contact this office.

## 2017-01-24 ENCOUNTER — Telehealth: Payer: Self-pay | Admitting: Physician Assistant

## 2017-01-24 NOTE — Telephone Encounter (Signed)
Pt calling requesting an alternative for Advair. Pt said with her new insurance, Advair is going to cost $350 so she would like to get something else.

## 2017-01-28 MED ORDER — BUDESONIDE-FORMOTEROL FUMARATE 80-4.5 MCG/ACT IN AERO: 2.0000 | INHALATION_SPRAY | Freq: Two times a day (BID) | RESPIRATORY_TRACT | 2 refills | Status: DC

## 2017-01-28 NOTE — Telephone Encounter (Signed)
Please call patient and let her know I have sent in a Rx for symbicort to see if insurance coverage is better for this compared to advair. Please let me know if this dose not work for patient in terms of cost. Also let pt know the dosing is different for symbicort compared to advair. It is 2 puffs twice daily. Return if sx are not controlled on this dose. Thank you.

## 2017-01-29 NOTE — Telephone Encounter (Signed)
Left detailed message with instructions.

## 2017-03-05 ENCOUNTER — Ambulatory Visit (INDEPENDENT_AMBULATORY_CARE_PROVIDER_SITE_OTHER): Payer: BLUE CROSS/BLUE SHIELD | Admitting: Physician Assistant

## 2017-03-05 ENCOUNTER — Encounter: Payer: Self-pay | Admitting: Physician Assistant

## 2017-03-05 VITALS — BP 112/78 | HR 83 | Temp 99.2°F | Resp 18 | Ht 66.61 in | Wt 208.4 lb

## 2017-03-05 DIAGNOSIS — R05 Cough: Secondary | ICD-10-CM | POA: Diagnosis not present

## 2017-03-05 DIAGNOSIS — R609 Edema, unspecified: Secondary | ICD-10-CM | POA: Diagnosis not present

## 2017-03-05 DIAGNOSIS — R059 Cough, unspecified: Secondary | ICD-10-CM

## 2017-03-05 MED ORDER — FLUTICASONE PROPIONATE 50 MCG/ACT NA SUSP: 2.0000 | Freq: Every day | NASAL | 6 refills | Status: DC

## 2017-03-05 MED ORDER — BENZONATATE 100 MG PO CAPS: ORAL_CAPSULE | ORAL | 0 refills | Status: AC

## 2017-03-05 MED ORDER — HYDROCODONE-HOMATROPINE 5-1.5 MG/5ML PO SYRP: 5.0000 mL | ORAL_SOLUTION | Freq: Three times a day (TID) | ORAL | 0 refills | Status: DC | PRN

## 2017-03-05 NOTE — Progress Notes (Signed)
Chelsea Bryant  MRN: [id number] DOB: 1976-04-27  PCP: Patient, No Pcp Per  Chief Complaint  Patient presents with  . Asthma  . Cough    pt states she cant quit coughing but feels fine x1week     Subjective:  Pt presents to clinic for concerns that she cannot stop coughing.  She has appt with allergist in 3 days because she does not think that she has asthma which she has been on medications for years.  She has been off her advair for 6 weeks and never been on singulair according to our records from the last 5 years. Flonase seems to run out of the right side of her nose every time she uses it - she is really congested and cannot seem to breath out her nose - she thinks as she tries to breath out her nose it triggers a cough and then once she starts coughing she cannot stop.  Started coughing about 1 week ago - her albuterol use since the cough has been 4x/day - and it helps her cough - she has been wheezing with the cough - the wheezing follows the cough - she feels like the cough got started with possible swallowing wrong about a week ago.  Cough is coming from throat not her chest.  She has used numbing throat spray which helps for a short period of time with the cough.  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  No problems with heartburn or indigestion.  Cough is worse not with laying down but with talking.    Past history - of allergy testing 10 years ago which was negative so the old allergist said must be asthma and she has been treated since then with advair and albuterol  History is obtained by patient.  Review of Systems  HENT: Positive for congestion. Negative for sinus pain.   Respiratory: Positive for cough and wheezing. Negative for shortness of breath.     Patient Active Problem List   Diagnosis Date Noted  . Nausea without vomiting 09/05/2016  . Mild  intermittent asthma with acute exacerbation 09/05/2016  . Cough 09/05/2016  . Asthma, moderate persistent, well-controlled 03/03/2015    Current Outpatient Medications on File Prior to Visit  Medication Sig Dispense Refill  . albuterol (PROAIR HFA) 108 (90 Base) MCG/ACT inhaler Inhale 2 puffs into the lungs every 6 (six) hours as needed for wheezing or shortness of breath. 8.5 Inhaler 1   No current facility-administered medications on file prior to visit.     Allergies  Allergen Reactions  . Metronidazole Hives    Past Medical History:  Diagnosis Date  . Asthma   . Whooping cough    Social History   Social History Narrative   Works - [employer]   In serious relationship   2 boys   1 granddaughter   Social History   Tobacco Use  . Smoking status: Former Games developermoker  . Smokeless tobacco: Never Used  Substance Use Topics  . Alcohol use: Yes    Comment: occ  . Drug use: No   family history includes Hyperlipidemia in her mother; Hypertension in her mother.     Objective:  BP 112/78   Pulse 83   Temp 99.2 F (37.3 C) (Oral)   Resp 18   Ht 5' 6.61" (1.692 m)   Wt 208 lb  6.4 oz (94.5 kg)   LMP 02/02/2017   SpO2 98%   BMI 33.02 kg/m  Body mass index is 33.02 kg/m.  Physical Exam  Constitutional: She is oriented to person, place, and time and well-developed, well-nourished, and in no distress.  Coughing in room  HENT:  Head: Normocephalic and atraumatic.  Right Ear: Hearing, tympanic membrane, external ear and ear canal normal.  Left Ear: Hearing, tympanic membrane, external ear and ear canal normal.  Nose: Mucosal edema: worse on the right - turbinates touching septum.  Mouth/Throat: Uvula is midline, oropharynx is clear and moist and mucous membranes are normal.  Eyes: Conjunctivae are normal.  Neck: Normal range of motion.  Cardiovascular: Normal rate, regular rhythm and normal heart sounds.  No murmur heard. Pulmonary/Chest: Effort normal. She has  wheezes (end expiratory - worse with forced expiration though it is hard for the patient due to cough).  Neurological: She is alert and oriented to person, place, and time. Gait normal.  Skin: Skin is warm and dry.  Psychiatric: Mood, memory, affect and judgment normal.  Vitals reviewed.   Assessment and Plan :  Cough - Plan: benzonatate (TESSALON) 100 MG capsule, HYDROcodone-homatropine (HYCODAN) 5-1.5 MG/5ML syrup  Mucosal edema - Plan: fluticasone (FLONASE) 50 MCG/ACT nasal spray   Called and spoke with allergist - I suspect that prednisone will help with both the congestion and the cough but she cannot be on it prior to allergy testing on Friday.  I suspect the patient might have cough variant asthma which is why the cough is improved with the albuterol - she has never been on singulair and that might help her symptoms.  She will continue the albuterol for now and flonase in hopes that it will help the sinus swelling.  She was given cough medication for symptoms control until it can be determined the cause of her cough.  Benny Lennert PA-C  Primary Care at [facility] 03/11/2017 8:47 PM

## 2017-03-05 NOTE — Patient Instructions (Signed)
     IF you received an x-ray today, you will receive an invoice from [facility]. Please contact  Radiology at [phone] with questions or concerns regarding your invoice.   IF you received labwork today, you will receive an invoice from [facility]. Please contact [facility] at [phone] with questions or concerns regarding your invoice.   Our billing staff will not be able to assist you with questions regarding bills from these companies.  You will be contacted with the lab results as soon as they are available. The fastest way to get your results is to activate your My Chart account. Instructions are located on the last page of this paperwork. If you have not heard from us regarding the results in 2 weeks, please contact this office.     

## 2017-03-07 ENCOUNTER — Ambulatory Visit: Payer: BLUE CROSS/BLUE SHIELD | Admitting: Allergy & Immunology

## 2017-03-08 ENCOUNTER — Encounter: Payer: Self-pay | Admitting: Allergy & Immunology

## 2017-03-08 ENCOUNTER — Ambulatory Visit (INDEPENDENT_AMBULATORY_CARE_PROVIDER_SITE_OTHER): Payer: BLUE CROSS/BLUE SHIELD | Admitting: Allergy & Immunology

## 2017-03-08 ENCOUNTER — Ambulatory Visit: Payer: PRIVATE HEALTH INSURANCE | Admitting: Allergy & Immunology

## 2017-03-08 VITALS — BP 118/78 | HR 70 | Temp 98.2°F | Resp 16 | Ht 66.0 in | Wt 209.0 lb

## 2017-03-08 DIAGNOSIS — R059 Cough, unspecified: Secondary | ICD-10-CM

## 2017-03-08 DIAGNOSIS — R05 Cough: Secondary | ICD-10-CM

## 2017-03-08 MED ORDER — MONTELUKAST SODIUM 10 MG PO TABS: ORAL_TABLET | ORAL | 5 refills | Status: DC

## 2017-03-08 NOTE — Progress Notes (Signed)
NEW PATIENT  Date of Service/Encounter:  03/08/17  Referring provider: Patient, No Pcp Per   Assessment:   Cough   Plan/Recommendations:   1. Cough - The history is not entirely clear, but with the recent emergence of a cough, I feel that this could be a sign of cough variant asthma.  - Pointing away from the diagnosis is the lack of reversibility today.  - Given the unclear history, we will refer you to get a methacholine challenge performed. - Order placed in Epic.  - In the meantime, we will add on an inhaled steroid (Arnuity one puff once daily) and Singulair 10mg  nighty to see if this might help with the cough.  - We deferred skin testing since it was negative in the past and she was not complaining of any classic allergic rhinitis symptoms.   2. Return in about 4 weeks (around 04/05/2017).   Subjective:   Chelsea Bryant is a 41 y.o. female presenting today for evaluation of  Chief Complaint  Patient presents with  . Allergic Rhinitis   . Cough    Chelsea Bryant has a history of the following: Patient Active Problem List   Diagnosis Date Noted  . Nausea without vomiting 09/05/2016  . Mild intermittent asthma with acute exacerbation 09/05/2016  . Cough 09/05/2016  . Asthma, moderate persistent, well-controlled 03/03/2015    History obtained from: chart review and patient and her partner.  Chelsea Bryant was referred by Patient, No Pcp Per.     Chelsea BradfordKimberly is a 41 y.o. female presenting for evaluation of a cough. She reports that she is here to rule out allergy and asthma to see whether her breathing issues are from her deviated septum.  The deviated septum was noted around 8 years ago when she had a sinus infection. This was performed due to a sinus infection. Six weeks ago, it was noted that the one side was "completely shut". She cannot breathe out of her nose. She may lightly snore at night, but this is not a prominent symptom. Sinus infections are  very infrequent, maybe one every five years or so.  She has had a problem with breathing for around ten years ago. She never had a problem with asthma as a child. Ten years ago, [place] in [place], but she cannot remember the name. She has been on Advair Diskus in the past. She did have a couple of other medications tried, but she was mostly treated with Advair. She had been on Advair consistently until six weeks ago. She did develop a cough two weeks ago, but otherwise she has done fine. She woke up with a dry nagging cough that has never worsened or been productive. She has had no fevers. She did have prednisone a handful of times over ten years, approximately five times in total.  She denies reflux and denies symptoms thereof. She did have reflux during her first pregnancy.   She was hospitalized for one week for shortness of breath; she went to the ED and had a workup for one week while in the hospital (January 2015). They never figured out what happened, and she was treated with a multitude of different medications. It is still not clear from reading the hospitalization records what was going on. She did work at [employer] for four years, quitting one year ago. At that time she was triggered by scents and whatnot. Overall her symptoms have improved since stopping her work at [employer].  Otherwise, there is no history of other atopic diseases, including asthma, drug allergies, food allergies, environmental allergies, stinging insect allergies, or urticaria. There is no significant infectious history. Vaccinations are up to date.    Past Medical History: Patient Active Problem List   Diagnosis Date Noted  . Nausea without vomiting 09/05/2016  . Mild intermittent asthma with acute exacerbation 09/05/2016  . Cough 09/05/2016  . Asthma, moderate persistent, well-controlled 03/03/2015    Medication List:  Allergies as of 03/08/2017      Reactions   Metronidazole Hives        Medication List        Accurate as of 03/08/17 11:59 PM. Always use your most recent med list.          albuterol 108 (90 Base) MCG/ACT inhaler Commonly known as:  PROAIR HFA Inhale 2 puffs into the lungs every 6 (six) hours as needed for wheezing or shortness of breath.   benzonatate 100 MG capsule Commonly known as:  TESSALON 1-2 po tid prn cough   fluticasone 50 MCG/ACT nasal spray Commonly known as:  FLONASE Place 2 sprays into both nostrils daily.   Fluticasone-Salmeterol 250-50 MCG/DOSE Aepb Commonly known as:  ADVAIR Inhale 1 puff into the lungs 2 (two) times daily.   HYDROcodone-homatropine 5-1.5 MG/5ML syrup Commonly known as:  HYCODAN Take 5 mLs by mouth every 8 (eight) hours as needed for cough.   montelukast 10 MG tablet Commonly known as:  SINGULAIR Take 1 tablet by mouth once each evening for coughing or wheezing.       Birth History: non-contributory.   Developmental History: non-contributory.   Past Surgical History: Past Surgical History:  Procedure Laterality Date  . CHOLECYSTECTOMY       Family History: Family History  Problem Relation Age of Onset  . Hypertension Mother   . Hyperlipidemia Mother   . Allergic rhinitis Neg Hx   . Angioedema Neg Hx   . Asthma Neg Hx   . Eczema Neg Hx   . Immunodeficiency Neg Hx   . Urticaria Neg Hx      Social History: Chelsea Bryant lives at home with her boyfriend. They live in a house with electric heating and central cooling. There are no animals inside out outside of the home.  They works n three different jobs, which keep her quite busy. She has no dust mite coverings on the bedd     Review of Systems: a 14-point review of systems is pertinent for what is mentioned in HPI.  Otherwise, all other systems were negative. Constitutional: negative other than that listed in the HPI Eyes: negative other than that listed in the HPI Ears, nose, mouth, throat, and face: negative other than that listed in the  HPI Respiratory: negative other than that listed in the HPI Cardiovascular: negative other than that listed in the HPI Gastrointestinal: negative other than that listed in the HPI Genitourinary: negative other than that listed in the HPI Integument: negative other than that listed in the HPI Hematologic: negative other than that listed in the HPI Musculoskeletal: negative other than that listed in the HPI Neurological: negative other than that listed in the HPI Allergy/Immunologic: negative other than that listed in the HPI    Objective:   Blood pressure 118/78, pulse 70, temperature 98.2 F (36.8 C), temperature source Oral, resp. rate 16, height 5\' 6"  (1.676 m), weight 208 lb 15.9 oz (94.8 kg), SpO2 97 %. Body mass index is 33.73 kg/m.   Physical Exam:  General: Alert, interactive, in no acute distress. Eyes: No conjunctival injection bilaterally, no discharge on the right, no discharge on the left and no Horner-Trantas dots present. PERRL bilaterally. EOMI without pain. No photophobia.  Ears: Right TM pearly gray with normal light reflex, Left TM pearly gray with normal light reflex, Right TM intact without perforation and Left TM intact without perforation.  Nose/Throat: External nose within normal limits and septum midline. Turbinates edematous and pale with clear discharge. Posterior oropharynx mildly erythematous without cobblestoning in the posterior oropharynx. Tonsils 2+ without exudates.  Tongue without thrush. Neck: Supple without thyromegaly. Trachea midline. Adenopathy: no enlarged lymph nodes appreciated in the anterior cervical, occipital, axillary, epitrochlear, inguinal, or popliteal regions. Lungs: Clear to auscultation without wheezing, rhonchi or rales. No increased work of breathing. CV: Normal S1/S2. No murmurs. Capillary refill <2 seconds.  Abdomen: Nondistended, nontender. No guarding or rebound tenderness. Bowel sounds present in all fields and hypoactive    Skin: Warm and dry, without lesions or rashes. Extremities:  No clubbing, cyanosis or edema. Neuro:   Grossly intact. No focal deficits appreciated. Responsive to questions.  Diagnostic studies:   Spirometry: results normal (FEV1: 2.56/81%, FVC: 3.19/81%, FEV1/FVC: 80%).    Spirometry consistent with normal pattern. Albuterol nebulizer treatment given in clinic with no improvement.  Allergy Studies: none    Malachi Bonds, MD [facility]

## 2017-03-08 NOTE — Patient Instructions (Addendum)
1. Cough - It is not entirely clear whether you have asthma at this time, so we will refer you to get a methacholine challenge performed. - This is a test to prove whether you have asthma or not. - They should call you to schedule this test. - In the meantime, we will add on an inhaled steroid (Arnuity one puff once daily) and Singulair 10mg  nighty to see if this might help with the cough. - These medications are useful in cough variant asthma.  2. Return in about 4 weeks (around 04/05/2017).    Please inform us of any Emergency Department visits, hospitalizations, or changes in symptoms. Call us before going to the ED for breathing or allergy symptoms since we might be able to fit you in for a sick visit. Feel free to contact us anytime with any questions, problems, or concerns.  It was a pleasure to meet you today! Enjoy the fall season!  Websites that have reliable patient information: 1. American Academy of Asthma, Allergy, and Immunology: www.aaaai.org 2. Food Allergy Research and Education (FARE): foodallergy.org 3. Mothers of Asthmatics: http://www.asthmacommunitynetwork.org 4. American College of Allergy, Asthma, and Immunology: www.acaai.org   Election Day is coming up on Tuesday, November 6th! Although it is too late to register to vote by mail, you can still register up to November 5th at any of the early voting locations. Try to early vote in case there are problems with your registration!   If you are turned away at the polls, you have the right to request a provisional ballot, which is required by law!      [facility] (open 8am - 5pm) First Floor [address], [place]   [place] (open 8am - 5pm) [address], [place]   [facility] (open 7am - 7pm)  [address], [place]   [facility] (open 7am - 7pm) [address], [place]   [place] (open 7am - 7pm) [address],  [place]   [facility] (open 7am - 7pm) [address], [place]   [facility] (open 7am - 7pm) [address], [place]   [address] (open 7am - 7pm) [address], [place]   [facility] (open 7am - 7pm) [address], [place]

## 2017-03-10 ENCOUNTER — Encounter: Payer: Self-pay | Admitting: Allergy & Immunology

## 2017-03-11 ENCOUNTER — Encounter: Payer: Self-pay | Admitting: Physician Assistant

## 2017-03-11 NOTE — Addendum Note (Signed)
Addended by: Clifton JamesLARK, Tamee Battin L on: 03/11/2017 12:08 PM   Modules accepted: Orders

## 2017-03-13 ENCOUNTER — Encounter (HOSPITAL_COMMUNITY): Payer: PRIVATE HEALTH INSURANCE

## 2017-03-20 ENCOUNTER — Ambulatory Visit (HOSPITAL_COMMUNITY)
Admission: RE | Admit: 2017-03-20 | Discharge: 2017-03-20 | Disposition: A | Discharge status: Home / Self Care | Payer: BLUE CROSS/BLUE SHIELD | Source: Ambulatory Visit | Attending: Allergy & Immunology | Admitting: Allergy & Immunology

## 2017-03-20 DIAGNOSIS — R059 Cough, unspecified: Secondary | ICD-10-CM

## 2017-03-20 DIAGNOSIS — R05 Cough: Secondary | ICD-10-CM | POA: Diagnosis not present

## 2017-03-20 LAB — PULMONARY FUNCTION TEST
FEF 25-75 Post: 1.86 L/sec
FEF 25-75 Pre: 2.05 L/sec
FEF2575-%Change-Post: -9 %
FEF2575-%Pred-Post: 59 %
FEF2575-%Pred-Pre: 65 %
FEV1-%Change-Post: -2 %
FEV1-%Pred-Post: 79 %
FEV1-%Pred-Pre: 81 %
FEV1-Post: 2.49 L
FEV1-Pre: 2.56 L
FEV1FVC-%Change-Post: 0 %
FEV1FVC-%Pred-Pre: 92 %
FEV6-%Change-Post: -3 %
FEV6-%Pred-Post: 85 %
FEV6-%Pred-Pre: 88 %
FEV6-Post: 3.26 L
FEV6-Pre: 3.38 L
FEV6FVC-%Change-Post: 0 %
FEV6FVC-%Pred-Post: 102 %
FEV6FVC-%Pred-Pre: 102 %
FVC-%Change-Post: -3 %
FVC-%Pred-Post: 83 %
FVC-%Pred-Pre: 86 %
FVC-Post: 3.26 L
FVC-Pre: 3.38 L
Post FEV1/FVC ratio: 77 %
Post FEV6/FVC ratio: 100 %
Pre FEV1/FVC ratio: 76 %
Pre FEV6/FVC Ratio: 100 %

## 2017-03-20 MED ORDER — ALBUTEROL SULFATE (2.5 MG/3ML) 0.083% IN NEBU
2.5000 mg | INHALATION_SOLUTION | Freq: Once | RESPIRATORY_TRACT | Status: AC
  Administered 2017-03-20: 2.5 mg via RESPIRATORY_TRACT

## 2017-03-20 MED ORDER — METHACHOLINE 4 MG/ML NEB SOLN
2.0000 mL | Freq: Once | RESPIRATORY_TRACT | Status: DC
  Filled 2017-03-20: qty 2

## 2017-03-20 MED ORDER — METHACHOLINE 0.0625 MG/ML NEB SOLN
2.0000 mL | Freq: Once | RESPIRATORY_TRACT | Status: AC
  Administered 2017-03-20: 0.125 mg via RESPIRATORY_TRACT
  Filled 2017-03-20: qty 2

## 2017-03-20 MED ORDER — METHACHOLINE 0.25 MG/ML NEB SOLN
2.0000 mL | Freq: Once | RESPIRATORY_TRACT | Status: AC
  Administered 2017-03-20: 0.5 mg via RESPIRATORY_TRACT
  Filled 2017-03-20: qty 2

## 2017-03-20 MED ORDER — SODIUM CHLORIDE 0.9 % IN NEBU
3.0000 mL | INHALATION_SOLUTION | Freq: Once | RESPIRATORY_TRACT | Status: AC
  Administered 2017-03-20: 3 mL via RESPIRATORY_TRACT
  Filled 2017-03-20: qty 3

## 2017-03-20 MED ORDER — METHACHOLINE 1 MG/ML NEB SOLN
2.0000 mL | Freq: Once | RESPIRATORY_TRACT | Status: AC
  Administered 2017-03-20: 2 mg via RESPIRATORY_TRACT
  Filled 2017-03-20: qty 2

## 2017-03-20 MED ORDER — METHACHOLINE 16 MG/ML NEB SOLN
2.0000 mL | Freq: Once | RESPIRATORY_TRACT | Status: DC
  Filled 2017-03-20: qty 2

## 2017-03-25 ENCOUNTER — Telehealth: Payer: Self-pay

## 2017-03-25 NOTE — Telephone Encounter (Signed)
Pt was wondering since the challenge was negative what you was wanting to do about her nose issues. She said that you mentioned us possibly referring her to an ent.  Please advise

## 2017-03-25 NOTE — Telephone Encounter (Signed)
noted 

## 2017-03-25 NOTE — Telephone Encounter (Signed)
I called the patient back to clarify any questions. She does understand that the methacholine challenge pointed towards an asthma diagnosis. She remains on her Singulair and Arnuity, but does not think that it is helping at all ("I feel the same, except I'm not coughing!"). I explained that coughing can be a sign of uncontrolled asthma as well, therefore I think the asthma medications are working well.  She is also concerned with her nasal symptoms. She does have a history of a right deviated septum, but her prominent symptom at her last visit was the chronic cough and whether she had asthma. This has been definitively answered. However, she would like to evaluate her chronic nasal congestion at this point. She has had allergy testing around ten years that was negative but is open to looking at this again. Therefore we will do testing on November 30th at 8:30am. Wyn ForsterMadison confirmed this date and [place] in [place]. We will also plan to start Xhance two sprays per nostril daily. We will provide a sample at that time.   Malachi BondsJoel Vickii Volland, MD [facility] [place]

## 2017-04-05 ENCOUNTER — Encounter: Payer: Self-pay | Admitting: Allergy & Immunology

## 2017-04-05 ENCOUNTER — Ambulatory Visit (INDEPENDENT_AMBULATORY_CARE_PROVIDER_SITE_OTHER): Payer: BLUE CROSS/BLUE SHIELD | Admitting: Allergy & Immunology

## 2017-04-05 VITALS — BP 108/68 | HR 76 | Temp 98.4°F | Resp 16

## 2017-04-05 DIAGNOSIS — J4530 Mild persistent asthma, uncomplicated: Secondary | ICD-10-CM

## 2017-04-05 DIAGNOSIS — J3089 Other allergic rhinitis: Secondary | ICD-10-CM | POA: Diagnosis not present

## 2017-04-05 DIAGNOSIS — J302 Other seasonal allergic rhinitis: Secondary | ICD-10-CM | POA: Diagnosis not present

## 2017-04-05 MED ORDER — FLUTICASONE PROPIONATE 93 MCG/ACT NA EXHU: 2.0000 | INHALANT_SUSPENSION | Freq: Two times a day (BID) | NASAL | 5 refills | Status: DC

## 2017-04-05 MED ORDER — MONTELUKAST SODIUM 10 MG PO TABS: ORAL_TABLET | ORAL | 5 refills | Status: DC

## 2017-04-05 NOTE — Progress Notes (Signed)
FOLLOW UP  Date of Service/Encounter:  04/05/17   Assessment:   Mild persistent asthma - confirmed with methacholine challenge  Seasonal and perennial allergic rhinitis (grasses, trees, dust mites)  Ear pruritis - middle ear, per patient  Plan/Recommendations:   1. Mild persistent asthma, uncomplicated - We deferred lung testing since you had pulmonary function testing recently. - Continue with the Arnuity 100mcg one puff once daily since this has been helping with your cough. - Daily controller medication(s): Singulair 10mg  daily and Arnuity 100mcg one puff once daily - Prior to physical activity: ProAir 2 puffs 10-15 minutes before physical activity. - Rescue medications: ProAir 4 puffs every 4-6 hours as needed - Changes during respiratory infections or worsening symptoms: Increase Arnuity 100mcg to 1 puff twice daily for TWO WEEKS. - Asthma control goals:  * Full participation in all desired activities (may need albuterol before activity) * Albuterol use two time or less a week on average (not counting use with activity) * Cough interfering with sleep two time or less a month * Oral steroids no more than once a year * No hospitalizations  2. Chronic rhinitis (grasses, trees, dust mites) - Testing today showed: trees, grasses and dust mites - The pollens were very minimally reactive, but the dust mite intradermal was much more impressive.  - We could consider the addition of Odactra at the next visit as a means of providing immunotherapy.  - Avoidance measures provided. - Continue with: Singulair (montelukast) 10mg  daily - Start taking: Xhance one spray per nostril twice daily and Allegra (fexofenadine) 180mg  table once daily  - I am hoping that the addition of the Allegra will help with the intense itching and the [place]hance will help with the nasal enlargement of the turbinates, which I hope will help with drainage of the middle ear.  - You can use an extra dose of the  antihistamine, if needed, for breakthrough symptoms.  - Consider nasal saline rinses 1-2 times daily to remove allergens from the nasal cavities as well as help with mucous clearance (this is especially helpful to do before the nasal sprays are given) - Consider allergy shots as a means of long-term control. - Allergy shots "re-train" and "reset" the immune system to ignore environmental allergens and decrease the resulting immune response to those allergens (sneezing, itchy watery eyes, runny nose, nasal congestion, etc).    - Allergy shots improve symptoms in 75-85% of patients.  - We can discuss more at the next appointment if the medications are not working for you.  3. Return in about 3 months (around 07/04/2017).  Subjective:   Chelsea Bryant is a 41 y.o. female presenting today for follow up of  Chief Complaint  Patient presents with  . Allergy Testing    Chelsea Bryant has a history of the following: Patient Active Problem List   Diagnosis Date Noted  . Mild persistent asthma, uncomplicated 04/05/2017  . Seasonal and perennial allergic rhinitis 04/05/2017  . Nausea without vomiting 09/05/2016  . Mild intermittent asthma with acute exacerbation 09/05/2016  . Cough 09/05/2016  . Asthma, moderate persistent, well-controlled 03/03/2015    History obtained from: chart review and patient.  Ranae PlumberKimberly A Bryant's Primary Care Provider is Patient, No Pcp Per.     Chelsea Bryant is a 41 y.o. female presenting for a follow up visit. She was last seen at the beginning of this month for an evaluation of cough. It was unclear from the history whether this was asthma or  not, therefore we started Arnuity one puff daily as well as Singulair 10mg  nightly. We also ordered a methacholine challenge to definitively rule out asthma. However, this was positive on the first dose of methacholine, confirming the diagnosis of asthma. She did report having less coughing while on the Arnuity. She is now  interested in allergy testing. She is also interested in starting [place] two sprays per nostril daily.   Since the last visit, she has done well. She remains on the Arnuity and feels that this has resolved her coughing completely. For this, she is not pleased since she wants to "blame [her] symptoms on her nose". She is fine with the diagnosis of asthma, but apparently wants to have something wrong with her nose as well. She is very vocal today about itching within her ear canal. However, when we discuss this further, she says that this is her middle ear that is itching. She denies postnasal drip and throat clearing. She denies any rhinorrhea. She denies a muffled sensation and her hearing is perfectly fine, according to the patient.   Otherwise, there have been no changes to her past medical history, surgical history, family history, or social history.    Review of Systems: a 14-point review of systems is pertinent for what is mentioned in HPI.  Otherwise, all other systems were negative. Constitutional: negative other than that listed in the HPI Eyes: negative other than that listed in the HPI Ears, nose, mouth, throat, and face: negative other than that listed in the HPI Respiratory: negative other than that listed in the HPI Cardiovascular: negative other than that listed in the HPI Gastrointestinal: negative other than that listed in the HPI Genitourinary: negative other than that listed in the HPI Integument: negative other than that listed in the HPI Hematologic: negative other than that listed in the HPI Musculoskeletal: negative other than that listed in the HPI Neurological: negative other than that listed in the HPI Allergy/Immunologic: negative other than that listed in the HPI    Objective:   Blood pressure 108/68, pulse 76, temperature 98.4 F (36.9 C), temperature source Oral, resp. rate 16, SpO2 97 %. There is no height or weight on file to calculate BMI.   Physical  Exam:   General: Alert, interactive, in no acute distress. Pleasant and talkative.  Eyes: No conjunctival injection bilaterally, no discharge on the right, no discharge on the left and no Horner-Trantas dots present. PERRL bilaterally. EOMI without pain. No photophobia.  Ears: Right TM pearly gray with normal light reflex, Left TM pearly gray with normal light reflex, Right TM intact without perforation and Left TM intact without perforation.  Nose/Throat: External nose within normal limits and septum midline. Turbinates edematous with clear discharge. Posterior oropharynx mildly erythematous without cobblestoning in the posterior oropharynx. Tonsils 2+ without exudates.  Tongue without thrush. Adenopathy: no enlarged lymph nodes appreciated in the anterior cervical, occipital, axillary, epitrochlear, inguinal, or popliteal regions. Lungs: Clear to auscultation without wheezing, rhonchi or rales. No increased work of breathing. CV: Normal S1/S2. No murmurs. Capillary refill <2 seconds.  Skin: Warm and dry, without lesions or rashes. Neuro:   Grossly intact. No focal deficits appreciated. Responsive to questions.  Diagnostic studies:   Allergy Studies:   Indoor/Outdoor Percutaneous Adult Environmental Panel: positive to [place] grass, maple and black walnut pollen. Otherwise negative with adequate controls.  Indoor/Outdoor Selected Intradermal Environmental Panel: positive to mite mix. Otherwise negative with adequate controls.     Malachi BondsJoel Leonard Feigel, MD FAAAAI [facility]

## 2017-04-05 NOTE — Patient Instructions (Addendum)
1. Mild persistent asthma, uncomplicated - We deferred lung testing since you had pulmonary function testing recently. - Continue with the Arnuity 100mcg one puff once daily since this has been helping with your cough. - Daily controller medication(s): Singulair 10mg  daily and Arnuity 100mcg one puff once daily - Prior to physical activity: ProAir 2 puffs 10-15 minutes before physical activity. - Rescue medications: ProAir 4 puffs every 4-6 hours as needed - Changes during respiratory infections or worsening symptoms: Increase Arnuity 100mcg to 1 puff twice daily for TWO WEEKS. - Asthma control goals:  * Full participation in all desired activities (may need albuterol before activity) * Albuterol use two time or less a week on average (not counting use with activity) * Cough interfering with sleep two time or less a month * Oral steroids no more than once a year * No hospitalizations  2. Chronic rhinitis - Testing today showed: trees, grasses and dust mites - Avoidance measures provided. - Continue with: Singulair (montelukast) 10mg  daily - Start taking: Xhance one spray per nostril twice daily and Allegra (fexofenadine) 180mg  table once daily  - I am hoping that the addition of the Allegra will help with the intense itching and the [place]hance will help with the nasal enlargement of the turbinates, which I hope will help with drainage of the middle ear.  - You can use an extra dose of the antihistamine, if needed, for breakthrough symptoms.  - Consider nasal saline rinses 1-2 times daily to remove allergens from the nasal cavities as well as help with mucous clearance (this is especially helpful to do before the nasal sprays are given) - Consider allergy shots as a means of long-term control. - Allergy shots "re-train" and "reset" the immune system to ignore environmental allergens and decrease the resulting immune response to those allergens (sneezing, itchy watery eyes, runny nose, nasal  congestion, etc).    - Allergy shots improve symptoms in 75-85% of patients.  - We can discuss more at the next appointment if the medications are not working for you.  3. Return in about 3 months (around 07/04/2017).   Please inform us of any Emergency Department visits, hospitalizations, or changes in symptoms. Call us before going to the ED for breathing or allergy symptoms since we might be able to fit you in for a sick visit. Feel free to contact us anytime with any questions, problems, or concerns.  It was a pleasure to see you and your family again today! Enjoy the winter season!  Websites that have reliable patient information: 1. American Academy of Asthma, Allergy, and Immunology: www.aaaai.org 2. Food Allergy Research and Education (FARE): foodallergy.org 3. Mothers of Asthmatics: http://www.asthmacommunitynetwork.org 4. American College of Allergy, Asthma, and Immunology: www.acaai.org  Reducing Pollen Exposure  The American Academy of Allergy, Asthma and Immunology suggests the following steps to reduce your exposure to pollen during allergy seasons.    1. Do not hang sheets or clothing out to dry; pollen may collect on these items. 2. Do not mow lawns or spend time around freshly cut grass; mowing stirs up pollen. 3. Keep windows closed at night.  Keep car windows closed while driving. 4. Minimize morning activities outdoors, a time when pollen counts are usually at their highest. 5. Stay indoors as much as possible when pollen counts or humidity is high and on windy days when pollen tends to remain in the air longer. 6. Use air conditioning when possible.  Many air conditioners have filters that trap the pollen  spores. 7. Use a HEPA room air filter to remove pollen form the indoor air you breathe.  Control of House Dust Mite Allergen    House dust mites play a major role in allergic asthma and rhinitis.  They occur in environments with high humidity wherever human skin,  the food for dust mites is found. High levels have been detected in dust obtained from mattresses, pillows, carpets, upholstered furniture, bed covers, clothes and soft toys.  The principal allergen of the house dust mite is found in its feces.  A gram of dust may contain 1,000 mites and 250,000 fecal particles.  Mite antigen is easily measured in the air during house cleaning activities.    1. Encase mattresses, including the box spring, and pillow, in an air tight cover.  Seal the zipper end of the encased mattresses with wide adhesive tape. 2. Wash the bedding in water of 130 degrees Farenheit weekly.  Avoid cotton comforters/quilts and flannel bedding: the most ideal bed covering is the dacron comforter. 3. Remove all upholstered furniture from the bedroom. 4. Remove carpets, carpet padding, rugs, and non-washable window drapes from the bedroom.  Wash drapes weekly or use plastic window coverings. 5. Remove all non-washable stuffed toys from the bedroom.  Wash stuffed toys weekly. 6. Have the room cleaned frequently with a vacuum cleaner and a damp dust-mop.  The patient should not be in a room which is being cleaned and should wait 1 hour after cleaning before going into the room. 7. Close and seal all heating outlets in the bedroom.  Otherwise, the room will become filled with dust-laden air.  An electric heater can be used to heat the room. 8. Reduce indoor humidity to less than 50%.  Do not use a humidifier.

## 2017-04-10 ENCOUNTER — Other Ambulatory Visit: Payer: Self-pay | Admitting: Allergy

## 2017-04-10 MED ORDER — FLUTICASONE PROPIONATE 93 MCG/ACT NA EXHU: 2.0000 | INHALANT_SUSPENSION | Freq: Two times a day (BID) | NASAL | 5 refills | Status: DC

## 2017-06-21 ENCOUNTER — Encounter: Payer: Self-pay | Admitting: Allergy & Immunology

## 2017-06-21 ENCOUNTER — Other Ambulatory Visit: Payer: Self-pay

## 2017-06-21 ENCOUNTER — Ambulatory Visit (INDEPENDENT_AMBULATORY_CARE_PROVIDER_SITE_OTHER): Payer: BLUE CROSS/BLUE SHIELD | Admitting: Allergy & Immunology

## 2017-06-21 VITALS — BP 100/70 | HR 70 | Temp 98.0°F | Resp 16 | Ht 65.95 in | Wt 201.1 lb

## 2017-06-21 DIAGNOSIS — J3089 Other allergic rhinitis: Secondary | ICD-10-CM | POA: Diagnosis not present

## 2017-06-21 DIAGNOSIS — J4530 Mild persistent asthma, uncomplicated: Secondary | ICD-10-CM | POA: Diagnosis not present

## 2017-06-21 DIAGNOSIS — J302 Other seasonal allergic rhinitis: Secondary | ICD-10-CM | POA: Diagnosis not present

## 2017-06-21 DIAGNOSIS — R0981 Nasal congestion: Secondary | ICD-10-CM | POA: Diagnosis not present

## 2017-06-21 MED ORDER — FLUTICASONE FUROATE 100 MCG/ACT IN AEPB: 1.0000 | INHALATION_SPRAY | Freq: Every day | RESPIRATORY_TRACT | 5 refills | Status: DC

## 2017-06-21 MED ORDER — AZELASTINE HCL 0.1 % NA SOLN
NASAL | 5 refills | Status: DC
Start: 2017-06-21 — End: 2017-06-21

## 2017-06-21 MED ORDER — AZELASTINE HCL 0.1 % NA SOLN: NASAL | 5 refills | Status: DC

## 2017-06-21 NOTE — Patient Instructions (Addendum)
1. Mild persistent asthma, uncomplicated - Lung testing looked good today.  - We will send in a script for Arnuity one puff once daily. - Daily controller medication(s): Singulair 10mg  daily and Arnuity one puff once daily - Prior to physical activity: ProAir 2 puffs 10-15 minutes before physical activity. - Rescue medications: ProAir 4 puffs every 4-6 hours as needed - Changes during respiratory infections or worsening symptoms: Increase Arnuity to 1 puff twice daily for TWO WEEKS. - Asthma control goals:  * Full participation in all desired activities (may need albuterol before activity) * Albuterol use two time or less a week on average (not counting use with activity) * Cough interfering with sleep two time or less a month * Oral steroids no more than once a year * No hospitalizations  2. Chronic rhinitis (trees, grasses and dust mites) - Continue with: Allegra (fexofenadine) 180mg  table once daily and Singulair (montelukast) 10mg  daily - Start taking: Flonase (fluticasone) one spray per nostril daily and Astelin (azelastine) 2 sprays per nostril 1-2 times daily as needed   - We will let the nasal antihistamine (Astelin) 2-4 weeks to work before referring you to ENT (call us with an update in one month). - You can use an extra dose of the antihistamine, if needed, for breakthrough symptoms.  - Consider allergy shots in the future.   3. Return in about 3 months (around 09/18/2017).   Please inform us of any Emergency Department visits, hospitalizations, or changes in symptoms. Call us before going to the ED for breathing or allergy symptoms since we might be able to fit you in for a sick visit. Feel free to contact us anytime with any questions, problems, or concerns.  It was a pleasure to see you and your family again today! Happy Valentine's Day!   Websites that have reliable patient information: 1. American Academy of Asthma, Allergy, and Immunology:  www.aaaai.org 2. Food Allergy Research and Education (FARE): foodallergy.org 3. Mothers of Asthmatics: http://www.asthmacommunitynetwork.org 4. American College of Allergy, Asthma, and Immunology: www.acaai.org   Allergy Shots   Allergies are the result of a chain reaction that starts in the immune system. Your immune system controls how your body defends itself. For instance, if you have an allergy to pollen, your immune system identifies pollen as an invader or allergen. Your immune system overreacts by producing antibodies called Immunoglobulin E (IgE). These antibodies travel to cells that release chemicals, causing an allergic reaction.  The concept behind allergy immunotherapy, whether it is received in the form of shots or tablets, is that the immune system can be desensitized to specific allergens that trigger allergy symptoms. Although it requires time and patience, the payback can be long-term relief.  How Do Allergy Shots Work?  Allergy shots work much like a vaccine. Your body responds to injected amounts of a particular allergen given in increasing doses, eventually developing a resistance and tolerance to it. Allergy shots can lead to decreased, minimal or no allergy symptoms.  There generally are two phases: build-up and maintenance. Build-up often ranges from three to six months and involves receiving injections with increasing amounts of the allergens. The shots are typically given once or twice a week, though more rapid build-up schedules are sometimes used.  The maintenance phase begins when the most effective dose is reached. This dose is different for each person, depending on how allergic you are and your response to the build-up injections. Once the maintenance dose is reached, there are longer  periods between injections, typically two to four weeks.  Occasionally doctors give cortisone-type shots that can temporarily reduce allergy symptoms. These types of shots are  different and should not be confused with allergy immunotherapy shots.  Who Can Be Treated with Allergy Shots?  Allergy shots may be a good treatment approach for people with allergic rhinitis (hay fever), allergic asthma, conjunctivitis (eye allergy) or stinging insect allergy.   Before deciding to begin allergy shots, you should consider:  . The length of allergy season and the severity of your symptoms . Whether medications and/or changes to your environment can control your symptoms . Your desire to avoid long-term medication use . Time: allergy immunotherapy requires a major time commitment . Cost: may vary depending on your insurance coverage  Allergy shots for children age 2five and older are effective and often well tolerated. They might prevent the onset of new allergen sensitivities or the progression to asthma.  Allergy shots are not started on patients who are pregnant but can be continued on patients who become pregnant while receiving them. In some patients with other medical conditions or who take certain common medications, allergy shots may be of risk. It is important to mention other medications you talk to your allergist.   When Will I Feel Better?  Some may experience decreased allergy symptoms during the build-up phase. For others, it may take as long as 12 months on the maintenance dose. If there is no improvement after a year of maintenance, your allergist will discuss other treatment options with you.  If you aren't responding to allergy shots, it may be because there is not enough dose of the allergen in your vaccine or there are missing allergens that were not identified during your allergy testing. Other reasons could be that there are high levels of the allergen in your environment or major exposure to non-allergic triggers like tobacco smoke.  What Is the Length of Treatment?  Once the maintenance dose is reached, allergy shots are generally continued for three  to five years. The decision to stop should be discussed with your allergist at that time. Some people may experience a permanent reduction of allergy symptoms. Others may relapse and a longer course of allergy shots can be considered.  What Are the Possible Reactions?  The two types of adverse reactions that can occur with allergy shots are local and systemic. Common local reactions include very mild redness and swelling at the injection site, which can happen immediately or several hours after. A systemic reaction, which is less common, affects the entire body or a particular body system. They are usually mild and typically respond quickly to medications. Signs include increased allergy symptoms such as sneezing, a stuffy nose or hives.  Rarely, a serious systemic reaction called anaphylaxis can develop. Symptoms include swelling in the throat, wheezing, a feeling of tightness in the chest, nausea or dizziness. Most serious systemic reactions develop within 30 minutes of allergy shots. This is why it is strongly recommended you wait in your doctor's office for 30 minutes after your injections. Your allergist is trained to watch for reactions, and his or her staff is trained and equipped with the proper medications to identify and treat them.  Who Should Administer Allergy Shots?  The preferred location for receiving shots is your prescribing allergist's office. Injections can sometimes be given at another facility where the physician and staff are trained to recognize and treat reactions, and have received instructions by your prescribing allergist.

## 2017-06-21 NOTE — Progress Notes (Signed)
FOLLOW UP  Date of Service/Encounter:  06/21/17   Assessment:   Mild persistent asthma - confirmed with methacholine challenge and improved on ICS  Seasonal and perennial allergic rhinitis (grasses, trees, dust mites)  Ear pruritis   Nasal congestion (R>L)   Asthma Reportables:  Severity: mild persistent  Risk: low Control: well controlled  Plan/Recommendations:   1. Mild persistent asthma, uncomplicated - Lung testing looked good today.  - We will send in a script for Arnuity 100mcg one puff once daily. - Daily controller medication(s): Singulair 10mg  daily and Arnuity 100mcg one puff once daily - Prior to physical activity: ProAir 2 puffs 10-15 minutes before physical activity. - Rescue medications: ProAir 4 puffs every 4-6 hours as needed - Changes during respiratory infections or worsening symptoms: Increase Arnuity 100mcg to 1 puff twice daily for TWO WEEKS. - Asthma control goals:  * Full participation in all desired activities (may need albuterol before activity) * Albuterol use two time or less a week on average (not counting use with activity) * Cough interfering with sleep two time or less a month * Oral steroids no more than once a year * No hospitalizations  2. Chronic rhinitis (trees, grasses and dust mites) - Continue with: Allegra (fexofenadine) 180mg  table once daily and Singulair (montelukast) 10mg  daily - Start taking: Flonase (fluticasone) one spray per nostril daily and Astelin (azelastine) 2 sprays per nostril 1-2 times daily as needed   - We will let the nasal antihistamine (Astelin) 2-4 weeks to work before referring you to ENT (call us with an update in one month). - You can use an extra dose of the antihistamine, if needed, for breakthrough symptoms.  - Consider allergy shots in the future.   3. Return in about 3 months (around 09/18/2017).   Subjective:   Clovia CuffKimberly A Griffin is a 42 y.o. female presenting today for follow up of  Chief  Complaint  Patient presents with  . Asthma    Clovia CuffKimberly A Griffin has a history of the following: Patient Active Problem List   Diagnosis Date Noted  . Mild persistent asthma, uncomplicated 04/05/2017  . Seasonal and perennial allergic rhinitis 04/05/2017  . Nausea without vomiting 09/05/2016  . Mild intermittent asthma with acute exacerbation 09/05/2016  . Cough 09/05/2016  . Asthma, moderate persistent, well-controlled 03/03/2015    History obtained from: chart review and patient.  Ranae PlumberKimberly A Griffin's Primary Care Provider is Patient, No Pcp Per.     Cala BradfordKimberly is a 42 y.o. female presenting for a follow up visit. She was last seen in November 2018. At that time, we did do skin testing that demonstrated positives to grasses, trees, and dust mites. She had a history of a chronic cough and it was not entirely clear that this was asthma. Therefore we obtained full pulmonary function testing with methacholine challenge that was positive for asthma. We started Arnuity 100mcg one puff once daily (instead of Advair, which was relatively expensive for her). We obtained testing for environmental allergens that demonstrated positives to trees, grasses, and dust mites. I felt that this was contributing to both her cough and her chronic nasal obstruction. We started [place]hance as well as Singulair and Allegra to see if this could control her symptoms.  Since the last visit, she has done well. We did start the Arnuity 100mcg once puff once daily. This has resolved the coughing. She did run out of the Arnuity a few weeks ago. But overall the cough has markedly improved and  she thinks that the Arnuity has helped with that. She has required no prednisone or ED visits for her symptoms.   Xhance did not seem to work any better than the [employer]. She still has chronic nasal congestion and cleans it out all of the time. Her right ear continues to have marked itching, although it should be noted that she is not using  her Allegra on a daily basis. She is not interested in allergy shots and prefers to control this with medications only. She has not seen ENT as of yet but is open to this.   Otherwise, there have been no changes to her past medical history, surgical history, family history, or social history.    Review of Systems: a 14-point review of systems is pertinent for what is mentioned in HPI.  Otherwise, all other systems were negative. Constitutional: negative other than that listed in the HPI Eyes: negative other than that listed in the HPI Ears, nose, mouth, throat, and face: negative other than that listed in the HPI Respiratory: negative other than that listed in the HPI Cardiovascular: negative other than that listed in the HPI Gastrointestinal: negative other than that listed in the HPI Genitourinary: negative other than that listed in the HPI Integument: negative other than that listed in the HPI Hematologic: negative other than that listed in the HPI Musculoskeletal: negative other than that listed in the HPI Neurological: negative other than that listed in the HPI Allergy/Immunologic: negative other than that listed in the HPI    Objective:   Blood pressure 100/70, pulse 70, temperature 98 F (36.7 C), temperature source Oral, resp. rate 16, height 5' 5.95" (1.675 m), weight 201 lb 1 oz (91.2 kg), SpO2 98 %. Body mass index is 32.51 kg/m.   Physical Exam:   General: Alert, interactive, in no acute distress. Pleasant female. Very talkative.  Eyes: No conjunctival injection bilaterally, no discharge on the right, no discharge on the left and no Horner-Trantas dots present. PERRL bilaterally. EOMI without pain. No photophobia.  Ears: Excoriations within the right external auditory canal with some cerumen production, Right TM pearly gray with normal light reflex, Left TM pearly gray with normal light reflex, Right TM intact without perforation and Left TM intact without perforation.    Nose/Throat: External nose within normal limits, nasal crease present and septum midline. Turbinates markedly edematous and pale without discharge. Posterior oropharynx erythematous with cobblestoning in the posterior oropharynx. Tonsils 2+ without exudates.  Tongue without thrush. Lungs: Clear to auscultation without wheezing, rhonchi or rales. No increased work of breathing. CV: Normal S1/S2. No murmurs. Capillary refill <2 seconds.  Skin: Warm and dry, without lesions or rashes. Neuro:   Grossly intact. No focal deficits appreciated. Responsive to questions.  Diagnostic studies:   Spirometry: results normal (FEV1: 2.52/80%, FVC: 3.53/90%, FEV1/FVC: 71%).    Spirometry consistent with normal pattern.  Allergy Studies: none      Malachi Bonds, MD [facility]

## 2017-08-23 ENCOUNTER — Other Ambulatory Visit: Payer: Self-pay | Admitting: Allergy & Immunology

## 2017-09-20 ENCOUNTER — Encounter: Payer: Self-pay | Admitting: Allergy & Immunology

## 2017-09-20 ENCOUNTER — Ambulatory Visit (INDEPENDENT_AMBULATORY_CARE_PROVIDER_SITE_OTHER): Payer: BLUE CROSS/BLUE SHIELD | Admitting: Allergy & Immunology

## 2017-09-20 ENCOUNTER — Telehealth: Payer: Self-pay

## 2017-09-20 VITALS — BP 102/66 | HR 68 | Temp 98.0°F | Resp 16 | Wt 205.0 lb

## 2017-09-20 DIAGNOSIS — J4541 Moderate persistent asthma with (acute) exacerbation: Secondary | ICD-10-CM | POA: Diagnosis not present

## 2017-09-20 DIAGNOSIS — J302 Other seasonal allergic rhinitis: Secondary | ICD-10-CM | POA: Diagnosis not present

## 2017-09-20 DIAGNOSIS — J3089 Other allergic rhinitis: Secondary | ICD-10-CM | POA: Diagnosis not present

## 2017-09-20 DIAGNOSIS — J4540 Moderate persistent asthma, uncomplicated: Secondary | ICD-10-CM

## 2017-09-20 DIAGNOSIS — R0981 Nasal congestion: Secondary | ICD-10-CM

## 2017-09-20 MED ORDER — METHYLPREDNISOLONE ACETATE 40 MG/ML IJ SUSP
40.0000 mg | Freq: Once | INTRAMUSCULAR | Status: AC
  Administered 2017-09-20: 40 mg via INTRAMUSCULAR

## 2017-09-20 NOTE — Progress Notes (Signed)
FOLLOW UP  Date of Service/Encounter:  09/20/17   Assessment:   Moderate persistent asthma with acute exacerbation  Nasal congestion - despite multiple nasal sprays  Seasonal and perennial allergic rhinitis (trees, grasses and dust mites)   Ms. Chelsea Bryant presents with a asthma exacerbation in the setting of continued nasal congestion.  She did have marked improvement with the nebulizer treatments today and we did administer a Depo-Medrol injection to provide long-lasting anti-inflammatory activity for the next few days.  She continues to have nasal congestion despite treatment with multiple nasal steroids.  She has been on [place], [place], and even [place] with absolutely no improvement whatsoever.  She reports nasal pruritus as well which has been unresponsive to antihistamines and nasal antihistamines, which is difficult to explain.  The pruritus certainly points towards an allergic etiology, but she would like to be evaluated by an otolaryngologist to see if there is something else that might be missing.   Plan/Recommendations:   1. Mild persistent asthma, uncomplicated - Lung testing looked lower today but it did improve with the nebulizer treatment. - We are going to step up your therapy to Breo (one puff once daily) which will replace Arnuity. - DepoMedrol given in clinic today (this should last in your system for 3-5 days). - Daily controller medication(s): Singulair  daily and Breo 100/41mcg one puff once daily - Prior to physical activity: ProAir 2 puffs 10-15 minutes before physical activity. - Rescue medications: ProAir 4 puffs every 4-6 hours as needed - Asthma control goals:  * Full participation in all desired activities (may need albuterol before activity) * Albuterol use two time or less a week on average (not counting use with activity) * Cough interfering with sleep two time or less a month * Oral steroids no more than once a year * No hospitalizations  2.  Chronic rhinitis (trees, grasses and dust mites) - Continue with: Allegra (fexofenadine)  table once daily, Singulair (montelukast)  daily, Flonase (fluticasone) one spray per nostril daily and Astelin (azelastine) 2 sprays per nostril 1-2 times daily as needed - We will refer you to see an ENT physician.  - Our referral coordinator will find someone close to your [place].  3. Return in about 3 months (around 12/21/2017).  Subjective:   Chelsea Bryant is a 42 y.o. female presenting today for follow up of  Chief Complaint  Patient presents with  . Asthma  . Cough  . Chest Pain  . Sinusitis    Chelsea Bryant has a history of the following: Patient Active Problem List   Diagnosis Date Noted  . Mild persistent asthma, uncomplicated 04/05/2017  . Seasonal and perennial allergic rhinitis 04/05/2017  . Nausea without vomiting 09/05/2016  . Mild intermittent asthma with acute exacerbation 09/05/2016  . Cough 09/05/2016  . Asthma, moderate persistent, well-controlled 03/03/2015    History obtained from: chart review and patient.  Chelsea Bryant's Primary Care Provider is Patient, No Pcp Per.     Prudence is a 42 y.o. female presenting for a sick visit.  She was last seen in February 2019.  At that time, she was doing well on her inhaled corticosteroid.  She had a methacholine challenge that confirmed a diagnosis of asthma.  She has a history of seasonal and perennial allergic rhinitis with sensitizations to grasses, trees, and dust mites. We continued her on Allegra 1 tablet once daily as well as Singulair 1 tablet daily.  We started her on Flonase as well as  Astelin as needed. We did discuss sending her to ENT but wanted to give the nasal sprays time to work.    Since the last visit, she has mostly done well. Over the last two weeks, she felt as bad as she did when she first started seeing me. She has having marked symptoms with the tree pollen, with symptoms  lasting only one day at the worst. She has had chest tightness, more than usual. Her significant other had bronchitis and she has felt bad since that time. She has bene using her rescue inhaler which does help. She has been using it more in the last two weeks than she was in the last four months.   She continues to have problems with nasal congestion. She is on [place] and feels absolutely no different compared to when she was on [place].  She has been on nasal antihistamines alone, but she continues to have nasal pruritus.  She cannot figure out how to improve her congestion.  We have tried oral antihistamines as well without improvement.  We are putting her on systemic steroids today, which hopefully will improve the congestion temporarily at least.  Otherwise, there have been no changes to her past medical history, surgical history, family history, or social history.    Review of Systems: a 14-point review of systems is pertinent for what is mentioned in HPI.  Otherwise, all other systems were negative. Constitutional: negative other than that listed in the HPI Eyes: negative other than that listed in the HPI Ears, nose, mouth, throat, and face: negative other than that listed in the HPI Respiratory: negative other than that listed in the HPI Cardiovascular: negative other than that listed in the HPI Gastrointestinal: negative other than that listed in the HPI Genitourinary: negative other than that listed in the HPI Integument: negative other than that listed in the HPI Hematologic: negative other than that listed in the HPI Musculoskeletal: negative other than that listed in the HPI Neurological: negative other than that listed in the HPI Allergy/Immunologic: negative other than that listed in the HPI    Objective:   Blood pressure 102/66, pulse 68, temperature 98 F (36.7 C), temperature source Oral, resp. rate 16, weight 205 lb (93 kg), SpO2 97 %. Body mass index is 33.14  kg/m.   Physical Exam:  General: Alert, interactive, in no acute distress. Breathing fast but able to form full sentences.  Eyes: No conjunctival injection bilaterally, no discharge on the right, no discharge on the left and no Horner-Trantas dots present. PERRL bilaterally. EOMI without pain. No photophobia.  Ears: Some cerumen present bilaterally but the TMs are clearly visualized, Right TM pearly gray with normal light reflex, Left OME, Right TM intact without perforation and Left TM intact without perforation.  Nose/Throat: External nose within normal limits and septum midline. Turbinates markedly edematous and pale with clear discharge. Posterior oropharynx mildly erythematous without cobblestoning in the posterior oropharynx. Tonsils 2+ without exudates.  Tongue without thrush. Lungs: Decreased breath sounds with expiratory wheezing bilaterally. No increased work of breathing. CV: Normal S1/S2. No murmurs. Capillary refill <2 seconds.  Skin: Warm and dry, without lesions or rashes. Neuro:   Grossly intact. No focal deficits appreciated. Responsive to questions.  Diagnostic studies:   Spirometry: results abnormal (FEV1: 2.29/72%, FVC: 3.15/80%, FEV1/FVC: 73%).    Spirometry consistent with normal pattern. Albuterol/Atrovent nebulizer treatment given in clinic with significant improvement in FEV1 per ATS criteria.  Allergy Studies: none       Francis Dowse  Ernst Bowler, MD  [facility]

## 2017-09-20 NOTE — Patient Instructions (Addendum)
1. Mild persistent asthma, uncomplicated - Lung testing looked lower today but it did improve with the nebulizer treatment. - We are going to step up your therapy to Breo (one puff once daily) which will replace Arnuity. - DepoMedrol given in clinic today (this should last in your system for 3-5 days). - Daily controller medication(s): Singulair  daily and Breo 100/29mcg one puff once daily - Prior to physical activity: ProAir 2 puffs 10-15 minutes before physical activity. - Rescue medications: ProAir 4 puffs every 4-6 hours as needed - Asthma control goals:  * Full participation in all desired activities (may need albuterol before activity) * Albuterol use two time or less a week on average (not counting use with activity) * Cough interfering with sleep two time or less a month * Oral steroids no more than once a year * No hospitalizations  2. Chronic rhinitis (trees, grasses and dust mites) - Continue with: Allegra (fexofenadine)  table once daily, Singulair (montelukast)  daily, Flonase (fluticasone) one spray per nostril daily and Astelin (azelastine) 2 sprays per nostril 1-2 times daily as needed - We will refer you to see an ENT physician.  - Our referral coordinator will find someone close to your [place].  3. Return in about 3 months (around 12/21/2017).   Please inform us of any Emergency Department visits, hospitalizations, or changes in symptoms. Call us before going to the ED for breathing or allergy symptoms since we might be able to fit you in for a sick visit. Feel free to contact us anytime with any questions, problems, or concerns.  It was a pleasure to see you again today!  Websites that have reliable patient information: 1. American Academy of Asthma, Allergy, and Immunology: www.aaaai.org 2. Food Allergy Research and Education (FARE): foodallergy.org 3. Mothers of Asthmatics: http://www.asthmacommunitynetwork.org 4. American College of Allergy,  Asthma, and Immunology: www.acaai.org

## 2017-09-20 NOTE — Telephone Encounter (Signed)
-----   Message from Alfonse Spruce, MD sent at 09/20/2017  1:26 PM EDT ----- ENT referral placed. Thanks!

## 2017-09-20 NOTE — Addendum Note (Signed)
Addended by: Alfonse Spruce on: 09/20/2017 01:26 PM   Modules accepted: Orders

## 2017-10-09 DIAGNOSIS — J343 Hypertrophy of nasal turbinates: Secondary | ICD-10-CM | POA: Diagnosis not present

## 2017-10-09 DIAGNOSIS — R0981 Nasal congestion: Secondary | ICD-10-CM | POA: Diagnosis not present

## 2017-10-09 DIAGNOSIS — H608X3 Other otitis externa, bilateral: Secondary | ICD-10-CM | POA: Diagnosis not present

## 2017-10-09 DIAGNOSIS — J342 Deviated nasal septum: Secondary | ICD-10-CM | POA: Diagnosis not present

## 2017-10-14 NOTE — Telephone Encounter (Signed)
Patient was [facility]. She is scheduled for surgery on July 8th

## 2017-11-11 ENCOUNTER — Other Ambulatory Visit: Payer: Self-pay

## 2017-11-11 ENCOUNTER — Encounter: Payer: Self-pay | Admitting: Physician Assistant

## 2017-11-11 ENCOUNTER — Ambulatory Visit: Payer: BLUE CROSS/BLUE SHIELD | Admitting: Physician Assistant

## 2017-11-11 VITALS — BP 108/62 | HR 69 | Temp 98.4°F | Ht 65.0 in | Wt 207.2 lb

## 2017-11-11 DIAGNOSIS — R05 Cough: Secondary | ICD-10-CM

## 2017-11-11 DIAGNOSIS — J209 Acute bronchitis, unspecified: Secondary | ICD-10-CM

## 2017-11-11 DIAGNOSIS — R059 Cough, unspecified: Secondary | ICD-10-CM

## 2017-11-11 MED ORDER — AZITHROMYCIN 250 MG PO TABS: ORAL_TABLET | ORAL | 0 refills | Status: DC

## 2017-11-11 MED ORDER — GUAIFENESIN ER 1200 MG PO TB12: 1.0000 | ORAL_TABLET | Freq: Two times a day (BID) | ORAL | 1 refills | Status: DC | PRN

## 2017-11-11 NOTE — Progress Notes (Signed)
Chelsea Bryant  MRN: [id number] DOB: 05-22-1975  PCP: Patient, No Pcp Per  Subjective:  Pt is a 42 year old female PMH asthma who presents to clinic for cough x 5 days.  She has been taking otc delsym. "Feels like it gets stuck in my chest" In the past few days she is using albuterol multiple times daily.  Seen at UC 3 days ago, she received a duo nebulizer and a rx for Prednisone taper x 9 days.   H/o Asthma - Arnuity and Albuterol as needed.   She was supposed to have surgery today for deviated nasal septum, but canceled due to current symptoms.   Review of Systems  Constitutional: Positive for fatigue. Negative for chills, diaphoresis and fever.  HENT: Positive for congestion. Negative for postnasal drip, rhinorrhea, sinus pressure, sinus pain, sneezing and sore throat.   Respiratory: Positive for cough, chest tightness, shortness of breath and wheezing.   Cardiovascular: Negative for chest pain and palpitations.    Patient Active Problem List   Diagnosis Date Noted  . Mild persistent asthma, uncomplicated 04/05/2017  . Seasonal and perennial allergic rhinitis 04/05/2017  . Nausea without vomiting 09/05/2016  . Mild intermittent asthma with acute exacerbation 09/05/2016  . Cough 09/05/2016  . Asthma, moderate persistent, well-controlled 03/03/2015    Current Outpatient Medications on File Prior to Visit  Medication Sig Dispense Refill  . albuterol (PROAIR HFA) 108 (90 Base) MCG/ACT inhaler Inhale 2 puffs into the lungs every 6 (six) hours as needed for wheezing or shortness of breath. 8.5 Inhaler 1  . azelastine (ASTELIN) 0.1 % nasal spray 2 sprays in each nostril 1-2 times a day 30 mL 5  . fluticasone (FLONASE) 50 MCG/ACT nasal spray Place 2 sprays into both nostrils daily. 16 g 6  . Fluticasone Furoate (ARNUITY ELLIPTA) 100 MCG/ACT AEPB Inhale 1 puff into the lungs daily. Rinse, gargle and spit out after use 30 each 5  . montelukast (SINGULAIR) 10 MG tablet Take 1  tablet by mouth once each evening for coughing or wheezing. 34 tablet 5  . predniSONE (DELTASONE) 20 MG tablet Take by mouth THREE tablets one time daily days 1-3, then two tablets one time daily days 4-6, and one tablet daily days 7-9.  #18.    Marland Kitchen. PROAIR HFA 108 (90 Base) MCG/ACT inhaler 2 PUFFS EVERY 6 HOURS AS NEEDED 8.5 Inhaler 1   No current facility-administered medications on file prior to visit.     Allergies  Allergen Reactions  . Metronidazole Hives     Objective:  BP 108/62 (BP Location: Left Arm, Patient Position: Sitting, Cuff Size: Large)   Pulse 69   Temp 98.4 F (36.9 C) (Oral)   Ht 5\' 5"  (1.651 m)   Wt 207 lb 3.2 oz (94 kg)   LMP 11/05/2017   SpO2 96%   BMI 34.48 kg/m   Physical Exam  Constitutional: She is oriented to person, place, and time. No distress.  Cardiovascular: Normal rate, regular rhythm and normal heart sounds.  Pulmonary/Chest: Effort normal. She has wheezes. She has rhonchi. She has no rales.  Neurological: She is alert and oriented to person, place, and time.  Skin: Skin is warm and dry.  Psychiatric: Judgment normal.  Vitals reviewed.   Assessment and Plan :  1. Acute bronchitis, unspecified organism -Patient presents for continued cough x5 days.  She was seen at urgent care a few days ago and is taking a prednisone taper at this time.  Her vitals  are stable.  She is not in acute distress.  She is not better - plan to add azithromycin and Mucinex.  Return to clinic in 5 to 7 days if no improvement. - azithromycin (ZITHROMAX) 250 MG tablet; Take 2 tabs PO x 1 dose, then 1 tab PO QD x 4 days  Dispense: 6 tablet; Refill: 0  2. Cough - Guaifenesin (MUCINEX MAXIMUM STRENGTH) 1200 MG TB12; Take 1 tablet (1,200 mg total) by mouth every 12 (twelve) hours as needed.  Dispense: 14 tablet; Refill: 1   Whitney Doyce Saling, PA-C  [facility] 11/11/2017 1:52 PM

## 2017-11-11 NOTE — Patient Instructions (Addendum)
Start taking mucinex twice daily x 5 days.  Start taking Azithromycin x 5 days.  Continue taking Prednisone.   Stay well hydrated. Get lost of rest. Wash your hands often.   -Foods that can help speed recovery: honey, garlic, chicken soup, elderberries, green tea.  -Supplements that can help speed recovery: vitamin C, zinc, elderberry extract, quercetin, ginseng, selenium -Supplement with prebiotics and probiotics:   Advil or ibuprofen for pain. Do not take Aspirin.  Drink enough water and fluids to keep your urine clear or pale yellow.   Cough Syrup Recipe: Sweet Lemon & Honey Thyme  Ingredients a handful of fresh thyme sprigs   1 pint of water (2 cups)  1/2 cup honey (raw is best, but regular will do)  1/2 lemon chopped Instructions 1. Place the lemon in the pint jar and cover with the honey. The honey will macerate the lemons and draw out liquids which taste so delicious! 2. Meanwhile, toss the thyme leaves into a saucepan and cover them with the water. 3. Bring the water to a gentle simmer and reduce it to half, about a cup of tea. 4. When the tea is reduced and cooled a bit, strain the sprigs & leaves, add it into the pint jar and stir it well. 5. Give it a shake and use a spoonful as needed. 6. Store your homemade cough syrup in the refrigerator for about a month.  What causes a cough? In adults, common causes of a cough include: ?An infection of the airways or lungs (such as the common cold) ?Postnasal drip - Postnasal drip is when mucus from the nose drips down or flows along the back of the throat. Postnasal drip can happen when people have: .A cold .Allergies .A sinus infection - The sinuses are hollow areas in the bones of the face that open into the nose. ?Lung conditions, like asthma and chronic obstructive pulmonary disease (COPD) - Both of these conditions can make it hard to breathe. COPD is usually caused by smoking. ?Acid reflux - Acid reflux is when the acid  that is normally in your stomach backs up into your esophagus (the tube that carries food from your mouth to your stomach). ?A side effect from blood pressure medicines called "ACE inhibitors" ?Smoking cigarettes  Is there anything I can do on my own to get rid of my cough? Yes. To help get rid of your cough, you can: ?Use a humidifier in your bedroom ?Use an over-the-counter cough medicine, or suck on cough drops or hard candy ?Stop smoking, if you smoke ?If you have allergies, avoid the things you are allergic to (like pollen, dust, animals, or mold) If you have acid reflux, your doctor or nurse will tell you which lifestyle changes can help reduce symptoms.     IF you received an x-ray today, you will receive an invoice from [facility]. Please contact [facility] at [phone] with questions or concerns regarding your invoice.   IF you received labwork today, you will receive an invoice from [facility]. Please contact [facility] at [phone] with questions or concerns regarding your invoice.   Our billing staff will not be able to assist you with questions regarding bills from these companies.  You will be contacted with the lab results as soon as they are available. The fastest way to get your results is to activate your My Chart account. Instructions are located on the last page of this paperwork. If you have not heard from us regarding the  results in 2 weeks, please contact this office.

## 2017-12-13 DIAGNOSIS — Z87891 Personal history of nicotine dependence: Secondary | ICD-10-CM | POA: Diagnosis not present

## 2017-12-13 DIAGNOSIS — J343 Hypertrophy of nasal turbinates: Secondary | ICD-10-CM | POA: Diagnosis not present

## 2017-12-13 DIAGNOSIS — J342 Deviated nasal septum: Secondary | ICD-10-CM | POA: Diagnosis not present

## 2017-12-27 ENCOUNTER — Ambulatory Visit (INDEPENDENT_AMBULATORY_CARE_PROVIDER_SITE_OTHER): Payer: BLUE CROSS/BLUE SHIELD | Admitting: Allergy & Immunology

## 2017-12-27 ENCOUNTER — Telehealth: Payer: Self-pay | Admitting: Allergy & Immunology

## 2017-12-27 ENCOUNTER — Encounter: Payer: Self-pay | Admitting: Allergy & Immunology

## 2017-12-27 VITALS — BP 108/70 | HR 78 | Temp 98.4°F | Resp 18

## 2017-12-27 DIAGNOSIS — J302 Other seasonal allergic rhinitis: Secondary | ICD-10-CM

## 2017-12-27 DIAGNOSIS — J4540 Moderate persistent asthma, uncomplicated: Secondary | ICD-10-CM

## 2017-12-27 DIAGNOSIS — J3089 Other allergic rhinitis: Secondary | ICD-10-CM

## 2017-12-27 MED ORDER — FLUTICASONE FUROATE 100 MCG/ACT IN AEPB: 1.0000 | INHALATION_SPRAY | Freq: Every day | RESPIRATORY_TRACT | 5 refills | Status: DC

## 2017-12-27 NOTE — Telephone Encounter (Signed)
Please call pt back regarding her bill and copay owed as she states her plan has changed with work.

## 2017-12-27 NOTE — Patient Instructions (Addendum)
1. Mild persistent asthma, uncomplicated - Lung testing looked good today.  - We will continue with Arnuity one puff once daily. - Daily controller medication(s): Singulair 10mg  daily and Arnuity 100mcg one puff once daily - Prior to physical activity: ProAir 2 puffs 10-15 minutes before physical activity. - Rescue medications: ProAir 4 puffs every 4-6 hours as needed - Asthma control goals:  * Full participation in all desired activities (may need albuterol before activity) * Albuterol use two time or less a week on average (not counting use with activity) * Cough interfering with sleep two time or less a month * Oral steroids no more than once a year * No hospitalizations   2. Chronic rhinitis (trees, grasses and dust mites) - Continue with: Singulair (montelukast) 10mg  daily  - We may restart nasal sprays in the future, but hold off for now while your nose heals.   3. Return in about 6 months (around 06/29/2018).   Please inform us of any Emergency Department visits, hospitalizations, or changes in symptoms. Call us before going to the ED for breathing or allergy symptoms since we might be able to fit you in for a sick visit. Feel free to contact us anytime with any questions, problems, or concerns.  It was a pleasure to see you again today!  Websites that have reliable patient information: 1. American Academy of Asthma, Allergy, and Immunology: www.aaaai.org 2. Food Allergy Research and Education (FARE): foodallergy.org 3. Mothers of Asthmatics: http://www.asthmacommunitynetwork.org 4. American College of Allergy, Asthma, and Immunology: www.acaai.org

## 2017-12-27 NOTE — Telephone Encounter (Signed)
I talked to [name] and answered all questions. She will send in a payment next week.

## 2017-12-27 NOTE — Progress Notes (Signed)
FOLLOW UP  Date of Service/Encounter:  12/27/17   Assessment:   Moderate persistent asthma without complication   Seasonal and perennial allergic rhinitis (trees, grasses and dust mites)  S/p septoplasty  Plan/Recommendations:   1. Mild persistent asthma, uncomplicated - Lung testing looked good today.  - We will continue with Arnuity one puff once daily. - Daily controller medication(s): Singulair 10mg  daily and Arnuity one puff once daily - Prior to physical activity: ProAir 2 puffs 10-15 minutes before physical activity. - Rescue medications: ProAir 4 puffs every 4-6 hours as needed - Asthma control goals:  * Full participation in all desired activities (may need albuterol before activity) * Albuterol use two time or less a week on average (not counting use with activity) * Cough interfering with sleep two time or less a month * Oral steroids no more than once a year * No hospitalizations   2. Chronic rhinitis (trees, grasses and dust mites) - Continue with: Singulair (montelukast) 10mg  daily  - We may restart nasal sprays in the future, but hold off for now while your nose heals.   3. Return in about 6 months (around 06/29/2018).  Subjective:   Chelsea Bryant is a 42 y.o. female presenting today for follow up of  Chief Complaint  Patient presents with  . Allergic Rhinitis   . Asthma    Chelsea Bryant has a history of the following: Patient Active Problem List   Diagnosis Date Noted  . Mild persistent asthma, uncomplicated 04/05/2017  . Seasonal and perennial allergic rhinitis 04/05/2017  . Nausea without vomiting 09/05/2016  . Mild intermittent asthma with acute exacerbation 09/05/2016  . Cough 09/05/2016  . Asthma, moderate persistent, well-controlled 03/03/2015    History obtained from: chart review and patient.  Chelsea Bryant's Primary Care Provider is Patient, No Pcp Per.     Chelsea Bryant is a 42 y.o. female presenting for a follow  up visit.  She was last seen in May 2019.  At that time, she was experiencing an asthma exacerbation.  We gave her a Depo-Medrol injection.  We stepped up her therapy to Breo 1 puff once daily in conjunction with Singulair.  We continued her on Allegra 180 mg daily, Singulair 10 mg daily, Flonase 1 spray per nostril daily, and as a lasting 2 sprays per nostril 1-2 times daily.  We also referred her to see an otolaryngologist.  In the interim, she did go to see Dr. Richardson Landry.  She has since undergone a septoplasty in early August. Since the last visit, she has done very well.   Asthma/Respiratory Symptom History: [name] remains on Arnuity one puff once daily. We had tried to get [facility] approved, but this was overly expensive for her. Therefore we settled on Arnuity. She did have another asthma exacerbation in July 2019 after having had an exacerbation in May when I saw her. However, since the surgery she has had minimal problems with her breathing. She feels that she may have had one use of her rescue inhaler since the surgery. Chelsea Bryant's asthma has been well controlled. She has not required rescue medication, experienced nocturnal awakenings due to lower respiratory symptoms, nor have activities of daily living been limited. She has required no Emergency Department or Urgent Care visits for her asthma. She has required one course of systemic steroids for asthma exacerbations since the last visit. ACT score today is 24, indicating excellent asthma symptom control.   Allergic Rhinitis Symptom History: Chelsea Bryant has  not restarted her nasal sprays at all. Her follow up visit to ENT was yesterday and she does not have another one scheduled. She is unsure when to restart her sprays. She does not feel quite back to normal after her surgery - with some pain and soreness occasionally - but she is doing fairly well.   Otherwise, there have been no changes to her past medical history, surgical history, family  history, or social history.    Review of Systems: a 14-point review of systems is pertinent for what is mentioned in HPI.  Otherwise, all other systems were negative. Constitutional: negative other than that listed in the HPI Eyes: negative other than that listed in the HPI Ears, nose, mouth, throat, and face: negative other than that listed in the HPI Respiratory: negative other than that listed in the HPI Cardiovascular: negative other than that listed in the HPI Gastrointestinal: negative other than that listed in the HPI Genitourinary: negative other than that listed in the HPI Integument: negative other than that listed in the HPI Hematologic: negative other than that listed in the HPI Musculoskeletal: negative other than that listed in the HPI Neurological: negative other than that listed in the HPI Allergy/Immunologic: negative other than that listed in the HPI    Objective:   Blood pressure 108/70, pulse 78, temperature 98.4 F (36.9 C), temperature source Oral, resp. rate 18, SpO2 100 %. There is no height or weight on file to calculate BMI.   Physical Exam:  General: Alert, interactive, in no acute distress. Pleasant female. Talkative. Appears in great spirits today.  Eyes: No conjunctival injection bilaterally, no discharge on the right, no discharge on the left and no Horner-Trantas dots present. PERRL bilaterally. EOMI without pain. No photophobia.  Ears: Right TM pearly gray with normal light reflex, Left TM pearly gray with normal light reflex, Right TM intact without perforation and Left TM intact without perforation.  Nose/Throat: External nose within normal limits and septum midline. Turbinates edematous and pale with clear discharge. Posterior oropharynx mildly erythematous without cobblestoning in the posterior oropharynx. Tonsils 2+ without exudates.  Tongue without thrush. Postop changes present.  Lungs: Clear to auscultation without wheezing, rhonchi or rales.  No increased work of breathing. CV: Normal S1/S2. No murmurs. Capillary refill <2 seconds.  Skin: Warm and dry, without lesions or rashes. Neuro:   Grossly intact. No focal deficits appreciated. Responsive to questions.  Diagnostic studies:   Spirometry: results normal (FEV1: 2.43/80%, FVC: 3.35/89%, FEV1/FVC: 73%).    Spirometry consistent with normal pattern.   Allergy Studies: none     Malachi BondsJoel Krystyne Tewksbury, MD  [facility]

## 2018-05-14 ENCOUNTER — Telehealth: Payer: Self-pay | Admitting: Allergy

## 2018-05-14 ENCOUNTER — Other Ambulatory Visit: Payer: Self-pay | Admitting: Allergy & Immunology

## 2018-05-14 NOTE — Telephone Encounter (Signed)
Dr. Dellis Anes. [facility] called and said the Arnuity was to expensive  wanted to know if you could fax her in something less expensive. She is going to [facility] this week-end to get married and didn't want to be that far away from home and not have a inhaler.Please adavise Thank you.

## 2018-05-15 MED ORDER — BECLOMETHASONE DIPROPIONATE 80 MCG/ACT IN AERS: 2.0000 | INHALATION_SPRAY | Freq: Two times a day (BID) | RESPIRATORY_TRACT | 6 refills | Status: DC

## 2018-05-15 NOTE — Telephone Encounter (Signed)
Sure I will send in Qvar two puffs BID. There is a copay card at the office she can pick up and she can even get a sample.  Malachi Bonds, MD [facility]

## 2018-05-16 NOTE — Telephone Encounter (Signed)
Spoke with patient.  She is aware of new rx and how to use it.

## 2018-07-03 ENCOUNTER — Ambulatory Visit: Payer: BLUE CROSS/BLUE SHIELD | Admitting: Allergy & Immunology

## 2018-07-04 ENCOUNTER — Ambulatory Visit: Payer: BLUE CROSS/BLUE SHIELD | Admitting: Allergy & Immunology

## 2018-07-17 ENCOUNTER — Encounter: Payer: Self-pay | Admitting: Allergy & Immunology

## 2018-07-17 ENCOUNTER — Ambulatory Visit: Payer: BLUE CROSS/BLUE SHIELD | Admitting: Allergy & Immunology

## 2018-07-17 ENCOUNTER — Other Ambulatory Visit: Payer: Self-pay

## 2018-07-17 VITALS — BP 120/70 | HR 89 | Temp 98.1°F | Resp 16 | Ht 66.0 in | Wt 210.3 lb

## 2018-07-17 DIAGNOSIS — J3089 Other allergic rhinitis: Secondary | ICD-10-CM

## 2018-07-17 DIAGNOSIS — J302 Other seasonal allergic rhinitis: Secondary | ICD-10-CM

## 2018-07-17 DIAGNOSIS — R609 Edema, unspecified: Secondary | ICD-10-CM

## 2018-07-17 DIAGNOSIS — J4540 Moderate persistent asthma, uncomplicated: Secondary | ICD-10-CM

## 2018-07-17 MED ORDER — MOMETASONE FUROATE 0.1 % EX CREA: TOPICAL_CREAM | CUTANEOUS | 6 refills | Status: AC

## 2018-07-17 MED ORDER — FLUTICASONE PROPIONATE 50 MCG/ACT NA SUSP: 2.0000 | Freq: Every day | NASAL | 6 refills | Status: DC

## 2018-07-17 MED ORDER — QVAR REDIHALER 80 MCG/ACT IN AERB: INHALATION_SPRAY | RESPIRATORY_TRACT | 6 refills | Status: DC

## 2018-07-17 MED ORDER — ALBUTEROL SULFATE HFA 108 (90 BASE) MCG/ACT IN AERS: INHALATION_SPRAY | RESPIRATORY_TRACT | 3 refills | Status: DC

## 2018-07-17 MED ORDER — AZELASTINE HCL 0.1 % NA SOLN: NASAL | 6 refills | Status: DC

## 2018-07-17 NOTE — Progress Notes (Signed)
FOLLOW UP  Date of Service/Encounter:  07/17/18   Assessment:   Moderate persistent asthma without complication   Seasonal and perennial allergic rhinitis (trees, grasses and dust mites)  S/p septoplasty  Plan/Recommendations:   1. Mild persistent asthma, uncomplicated - Lung testing looked good today.  - We will continue with Qvar two puffs twice daily - Daily controller medication(s): Singulair 10mg  daily and Qvar Redihaler 2 puffs twice daily - Prior to physical activity: ProAir 2 puffs 10-15 minutes before physical activity. - Rescue medications: ProAir 4 puffs every 4-6 hours as needed - Asthma control goals:  * Full participation in all desired activities (may need albuterol before activity) * Albuterol use two time or less a week on average (not counting use with activity) * Cough interfering with sleep two time or less a month * Oral steroids no more than once a year * No hospitalizations   2. Chronic rhinitis (trees, grasses and dust mites) - Continue with: Singulair (montelukast) 10mg  daily  - Use nasal saline as needed.   3. Return in about 1 year (around 07/17/2019).  Subjective:   Chelsea Bryant is a 43 y.o. female presenting today for follow up of  Chief Complaint  Patient presents with  . Asthma    Chelsea Bryant has a history of the following: Patient Active Problem List   Diagnosis Date Noted  . Mild persistent asthma, uncomplicated 04/05/2017  . Seasonal and perennial allergic rhinitis 04/05/2017  . Nausea without vomiting 09/05/2016  . Mild intermittent asthma with acute exacerbation 09/05/2016  . Cough 09/05/2016  . Asthma, moderate persistent, well-controlled 03/03/2015    History obtained from: chart review and patient.  Chelsea Bryant is a 43 y.o. female presenting for a follow up visit.  She was last seen in August 2019.  At that time, her lung testing looked great.  We continued Arnuity 100 mcg 1 puff daily as well as  Singulair 10 mg daily.  For her chronic rhinitis, we continued Singulair.  We stopped her no sprays since she had undergone a recent septoplasty.  She has a history of sensitizations to trees, grasses, and dust mites.  Asthma/Respiratory Symptom History: She did have a methacholine challenge around the time that I first saw her in late 2018 or early 2019 that was consistent with asthma.  Although she was on Arnuity at one point, this was changed to Qvar 80 mcg 2 puffs twice daily because this was cheaper for her insurance.  With this, she has only needed to use her rescue inhaler around 2-3 times per month at the very most.  She has not needed any prednisone.  ACT score is 22, indicating excellent asthma control.  Allergic Rhinitis Symptom History: She remains on her Singulair 10 mg daily.  She is doing well following her septoplasty.  Dr. Christell Constant has discharged her from the ENT clinic.  She does not use any nose sprays.  Her breathing has markedly improved.  Otherwise, there have been no changes to her past medical history, surgical history, family history, or social history.  She remains very busy with her work schedule.  She works as a Production designer, theatre/television/film at [employer] in [place].  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  All in all, she works around 80 hours/week.  She recently got married in January 2020.  They went to [facility] for their honeymoon.    Review of Systems  Constitutional: Negative.  Negative  for fever, malaise/fatigue and weight loss.  HENT: Negative.  Negative for congestion, ear discharge and ear pain.   Eyes: Negative for pain, discharge and redness.  Respiratory: Negative for cough, sputum production, shortness of breath and wheezing.   Cardiovascular: Negative.  Negative for chest pain and palpitations.  Gastrointestinal: Negative for abdominal pain and heartburn.  Skin: Negative.  Negative for itching and rash.  Neurological: Negative  for dizziness and headaches.  Endo/Heme/Allergies: Negative for environmental allergies. Does not bruise/bleed easily.       Objective:   Blood pressure 120/70, pulse 89, temperature 98.1 F (36.7 C), temperature source Oral, resp. rate 16, height 5\' 6"  (1.676 m), weight 210 lb 5.1 oz (95.4 kg), SpO2 97 %. Body mass index is 33.95 kg/m.   Physical Exam:  Physical Exam  Constitutional: She appears well-developed.  Talkative pleasant female.  HENT:  Head: Normocephalic and atraumatic.  Right Ear: Tympanic membrane, external ear and ear canal normal.  Left Ear: Tympanic membrane, external ear and ear canal normal.  Nose: Rhinorrhea present. No mucosal edema, nasal deformity or septal deviation. No epistaxis. Right sinus exhibits no maxillary sinus tenderness and no frontal sinus tenderness. Left sinus exhibits no maxillary sinus tenderness and no frontal sinus tenderness.  Mouth/Throat: Uvula is midline and oropharynx is clear and moist. Mucous membranes are not pale and not dry.  Normal-appearing turbinates.  Scant discharge.  Eyes: Pupils are equal, round, and reactive to light. Conjunctivae and EOM are normal. Right eye exhibits no chemosis and no discharge. Left eye exhibits no chemosis and no discharge. Right conjunctiva is not injected. Left conjunctiva is not injected.  Cardiovascular: Normal rate, regular rhythm and normal heart sounds.  Respiratory: Effort normal and breath sounds normal. No accessory muscle usage. No tachypnea. No respiratory distress. She has no wheezes. She has no rhonchi. She has no rales. She exhibits no tenderness.  Moving air well in all lung fields.  Lymphadenopathy:    She has no cervical adenopathy.  Neurological: She is alert.  Skin: No abrasion, no petechiae and no rash noted. Rash is not papular, not vesicular and not urticarial. No erythema. No pallor.  Psychiatric: She has a normal mood and affect.     Diagnostic studies:    Spirometry:  results normal (FEV1: 2.44/78%, FVC: 3.19/82%, FEV1/FVC: 76%).    Spirometry consistent with normal pattern.   Allergy Studies: none     Malachi Bonds, MD  [facility]

## 2018-07-17 NOTE — Patient Instructions (Addendum)
1. Mild persistent asthma, uncomplicated - Lung testing looked good today.  - We will continue with Qvar two puffs twice daily - Daily controller medication(s): Singulair 10mg  daily and Qvar Redihaler 2 puffs twice daily - Prior to physical activity: ProAir 2 puffs 10-15 minutes before physical activity. - Rescue medications: ProAir 4 puffs every 4-6 hours as needed - Asthma control goals:  * Full participation in all desired activities (may need albuterol before activity) * Albuterol use two time or less a week on average (not counting use with activity) * Cough interfering with sleep two time or less a month * Oral steroids no more than once a year * No hospitalizations   2. Chronic rhinitis (trees, grasses and dust mites) - Continue with: Singulair (montelukast) 10mg  daily  - Use nasal saline as needed.   3. Return in about 1 year (around 07/17/2019).   Please inform us of any Emergency Department visits, hospitalizations, or changes in symptoms. Call us before going to the ED for breathing or allergy symptoms since we might be able to fit you in for a sick visit. Feel free to contact us anytime with any questions, problems, or concerns.  It was a pleasure to see you again today! Good luck with the call tomorrow morning!   Websites that have reliable patient information: 1. American Academy of Asthma, Allergy, and Immunology: www.aaaai.org 2. Food Allergy Research and Education (FARE): foodallergy.org 3. Mothers of Asthmatics: http://www.asthmacommunitynetwork.org 4. American College of Allergy, Asthma, and Immunology: www.acaai.org

## 2018-07-17 NOTE — Addendum Note (Signed)
Addended by: Florence Canner on: 07/17/2018 05:09 PM   Modules accepted: Orders

## 2018-11-30 DIAGNOSIS — M79651 Pain in right thigh: Secondary | ICD-10-CM | POA: Diagnosis not present

## 2018-11-30 DIAGNOSIS — J45909 Unspecified asthma, uncomplicated: Secondary | ICD-10-CM | POA: Diagnosis not present

## 2018-11-30 DIAGNOSIS — M545 Low back pain: Secondary | ICD-10-CM | POA: Diagnosis not present

## 2018-11-30 DIAGNOSIS — Z7951 Long term (current) use of inhaled steroids: Secondary | ICD-10-CM | POA: Diagnosis not present

## 2018-11-30 DIAGNOSIS — R200 Anesthesia of skin: Secondary | ICD-10-CM | POA: Diagnosis not present

## 2018-11-30 DIAGNOSIS — Z79899 Other long term (current) drug therapy: Secondary | ICD-10-CM | POA: Diagnosis not present

## 2018-12-03 ENCOUNTER — Ambulatory Visit (INDEPENDENT_AMBULATORY_CARE_PROVIDER_SITE_OTHER): Payer: BC Managed Care – PPO

## 2018-12-03 ENCOUNTER — Ambulatory Visit: Payer: BC Managed Care – PPO | Admitting: Family Medicine

## 2018-12-03 ENCOUNTER — Other Ambulatory Visit: Payer: Self-pay

## 2018-12-03 VITALS — BP 121/66 | HR 66 | Temp 98.5°F | Ht 66.0 in | Wt 230.8 lb

## 2018-12-03 DIAGNOSIS — M549 Dorsalgia, unspecified: Secondary | ICD-10-CM

## 2018-12-03 DIAGNOSIS — R350 Frequency of micturition: Secondary | ICD-10-CM | POA: Diagnosis not present

## 2018-12-03 DIAGNOSIS — M545 Low back pain: Secondary | ICD-10-CM | POA: Diagnosis not present

## 2018-12-03 DIAGNOSIS — G8929 Other chronic pain: Secondary | ICD-10-CM | POA: Diagnosis not present

## 2018-12-03 LAB — POCT URINALYSIS DIP (CLINITEK)
Bilirubin, UA: NEGATIVE
Blood, UA: NEGATIVE
Glucose, UA: NEGATIVE mg/dL
Ketones, POC UA: NEGATIVE mg/dL
Leukocytes, UA: NEGATIVE
Nitrite, UA: NEGATIVE
POC PROTEIN,UA: NEGATIVE
Spec Grav, UA: 1.02 (ref 1.010–1.025)
Urobilinogen, UA: 0.2 E.U./dL
pH, UA: 5.5 (ref 5.0–8.0)

## 2018-12-03 NOTE — Progress Notes (Addendum)
Acute Office Visit  Subjective:    Patient ID: Chelsea Bryant, female    DOB: 07/27/1975, 43 y.o.   MRN: [id number]  Chief Complaint  Patient presents with  . 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 She was on vacation with her husband and dropped her purse from shoulder to arm. Pt states she did not have any other concerns but on Sunday had difficulty getting out of bed. Pt states she and husband drove back to [place] and she went to the ED-7/26. Pain described as abrupt onset of sharp stabbing pain worse with sitting.  Radiation into the lateral leg. No radiation into the buttocks or groin. Pt with no prior h/o injury. Pt with no prior back pain.  No change in BM.  Pt diagnosed with lumbar back pain and given prednisone, hydrocodone and robaxin. Pt states pain medication helps pain symptoms but does not resolve.  Robaxin makes pt sleepy.  Pain better with walking and supine but positional change the most helpful   She is complaning also of frequent urination with a cloudy appearance and with a small odor.pt denies prior UTI. Pt with no blood in urine.  Pt with no urgency or frequency.     Past Medical History:  Diagnosis Date  . Allergic rhinitis   . Asthma   . Whooping cough     Past Surgical History:  Procedure Laterality Date  . CHOLECYSTECTOMY    . NASAL SEPTUM SURGERY    . NASAL SINUS SURGERY     deviated septum repair    Family History  Problem Relation Age of Onset  . Hypertension Mother   . Hyperlipidemia Mother   . Allergic rhinitis Neg Hx   . Angioedema Neg Hx   . Asthma Neg Hx   . Eczema Neg Hx   . Immunodeficiency Neg Hx   . Urticaria Neg Hx     Social History   Socioeconomic History  . Marital status: Single    Spouse name: Not on file  . Number of children: Not on file  . Years of  education: Not on file  . Highest education level: Not on file  Occupational History  . Not on file  Social Needs  . Financial resource strain: Not on file  . Food insecurity    Worry: Not on file    Inability: Not on file  . Transportation needs    Medical: Not on file    Non-medical: Not on file  Tobacco Use  . Smoking status: Former Games developermoker  . Smokeless tobacco: Never Used  Substance and Sexual Activity  . Alcohol use: Yes    Comment: occ  . Drug use: No  . Sexual activity: Yes    Birth control/protection: None  Lifestyle  . Physical activity    Days per week: Not on file    Minutes per session: Not on file  . Stress: Not on file  Relationships  . Social Musicianconnections    Talks on phone: Not on file    Gets together: Not on file    Attends religious service: Not on file    Active member of club or organization: Not on file    Attends meetings of clubs or organizations: Not on file    Relationship status: Not on  file  . Intimate partner violence    Fear of current or ex partner: Not on file    Emotionally abused: Not on file    Physically abused: Not on file    Forced sexual activity: Not on file  Other Topics Concern  . Not on file  Social History Narrative   Works - [place]   In serious relationship   2 boys   1 granddaughter    Outpatient Medications Prior to Visit  Medication Sig Dispense Refill  . albuterol (PROAIR HFA) 108 (90 Base) MCG/ACT inhaler 2 PUFFS EVERY 6 HOURS AS NEEDED 8.5 Inhaler 3  . azelastine (ASTELIN) 0.1 % nasal spray 2 sprays in each nostril 1-2 times a day 30 mL 6  . fluticasone (FLONASE) 50 MCG/ACT nasal spray Place 2 sprays into both nostrils daily. 16 g 6  . mometasone (ELOCON) 0.1 % cream Apply to the affected area twice a day for up to 5 days as needed for itching 45 g 6  . QVAR REDIHALER 80 MCG/ACT inhaler 1-2 puffs daily 1 Inhaler 6   No facility-administered medications prior to visit.     Allergies  Allergen Reactions   . Metronidazole Hives    Review of Systems  Constitutional: Negative for fever.  Gastrointestinal: Negative for constipation, nausea and vomiting.  Genitourinary: Negative for dysuria, hematuria and urgency.  Musculoskeletal: Positive for back pain. Negative for myalgias.       Objective:    Physical Exam  Constitutional: She is oriented to person, place, and time. She appears well-developed and well-nourished. No distress.  HENT:  Head: Normocephalic and atraumatic.  Eyes: Conjunctivae are normal.  Neck: Normal range of motion. Neck supple.  Cardiovascular: Normal rate and regular rhythm.  Pulmonary/Chest: Effort normal and breath sounds normal.  Musculoskeletal: Normal range of motion.        General: Tenderness present.  Neurological: She is alert and oriented to person, place, and time.  Skin: She is not diaphoretic.  Psychiatric: She has a normal mood and affect.    BP 121/66 (BP Location: Right Arm, Patient Position: Sitting, Cuff Size: Normal)   Pulse 66   Temp 98.5 F (36.9 C) (Oral)   Ht 5\' 6"  (1.676 m)   Wt 230 lb 12.8 oz (104.7 kg)   SpO2 97%   BMI 37.25 kg/m  Wt Readings from Last 3 Encounters:  12/03/18 230 lb 12.8 oz (104.7 kg)  07/17/18 210 lb 5.1 oz (95.4 kg)  11/11/17 207 lb 3.2 oz (94 kg)    Health Maintenance Due  Topic Date Due  . HIV Screening  01/29/1989  . PAP SMEAR-Modifier  01/30/1995    There are no preventive care reminders to display for this patient.   No results found for: TSH Lab Results  Component Value Date   WBC 5.2 06/10/2015   HGB 13.5 06/10/2015   HCT 38.0 06/10/2015   MCV 84.3 06/10/2015      Assessment & Plan:   Problem List Items Addressed This Visit    None    Visit Diagnoses    Frequent urination    -  Primary   Relevant Orders   POCT URINALYSIS DIP (CLINITEK)   Chronic right-sided back pain, unspecified back location       Relevant Orders   POCT URINALYSIS DIP (CLINITEK)    FINDINGS: reviewed with  pt Frontal, lateral, spot lumbosacral lateral, and bilateral oblique views were obtained. There are 5 non-rib-bearing lumbar type vertebral bodies. There is  no fracture or spondylolisthesis. There is mild disc space narrowing at L3-4. Other disc spaces appear unremarkable. There is facet osteoarthritic change at L5-S1 bilaterally. There are Essure fallopian tube stents bilaterally.  IMPRESSION: Areas of relatively mild osteoarthritic change. No fracture or Spondylolisthesis.  U/a-normal-increase water, avoid caffeine-reviewed with pt PT referral Spine specialist referral-if improving with medication and exercise/stretching-cancel appt  LISA Mat CarneLEIGH CORUM, MD

## 2019-01-09 ENCOUNTER — Other Ambulatory Visit: Payer: Self-pay | Admitting: Allergy & Immunology

## 2019-01-09 NOTE — Telephone Encounter (Signed)
PT called for arnuity ellipta to same pharmacy.

## 2019-01-09 NOTE — Telephone Encounter (Signed)
LVM pt was switched in January from arnuity to qvar because arnuity was too expensive- need to know if she wants to go back to this or if she wants refill of qvar.

## 2019-01-13 MED ORDER — QVAR REDIHALER 80 MCG/ACT IN AERB: 2.0000 | INHALATION_SPRAY | Freq: Two times a day (BID) | RESPIRATORY_TRACT | 5 refills | Status: DC

## 2019-01-14 MED ORDER — ARNUITY ELLIPTA 100 MCG/ACT IN AEPB: 1.0000 | INHALATION_SPRAY | Freq: Every day | RESPIRATORY_TRACT | 6 refills | Status: DC

## 2019-01-14 NOTE — Telephone Encounter (Signed)
Pt called and states that now her insurance covers arnuity again and that her insurance keeps going back and fourth between coverage of that and qvar. I [facility].

## 2019-01-14 NOTE — Addendum Note (Signed)
Addended by: Katherina Right D on: 01/14/2019 04:30 PM   Modules accepted: Orders

## 2019-07-08 DIAGNOSIS — Z20822 Contact with and (suspected) exposure to covid-19: Secondary | ICD-10-CM | POA: Diagnosis not present

## 2019-07-14 DIAGNOSIS — J1282 Pneumonia due to coronavirus disease 2019: Secondary | ICD-10-CM | POA: Diagnosis not present

## 2019-07-14 DIAGNOSIS — R0602 Shortness of breath: Secondary | ICD-10-CM | POA: Diagnosis not present

## 2019-07-14 DIAGNOSIS — U071 COVID-19: Secondary | ICD-10-CM | POA: Diagnosis not present

## 2019-07-18 DIAGNOSIS — R0981 Nasal congestion: Secondary | ICD-10-CM | POA: Diagnosis not present

## 2019-07-18 DIAGNOSIS — R6883 Chills (without fever): Secondary | ICD-10-CM | POA: Diagnosis not present

## 2019-07-18 DIAGNOSIS — R0602 Shortness of breath: Secondary | ICD-10-CM | POA: Diagnosis not present

## 2019-07-18 DIAGNOSIS — M549 Dorsalgia, unspecified: Secondary | ICD-10-CM | POA: Diagnosis not present

## 2019-07-18 DIAGNOSIS — J0120 Acute ethmoidal sinusitis, unspecified: Secondary | ICD-10-CM | POA: Diagnosis not present

## 2019-07-18 DIAGNOSIS — R519 Headache, unspecified: Secondary | ICD-10-CM | POA: Diagnosis not present

## 2019-07-18 DIAGNOSIS — R05 Cough: Secondary | ICD-10-CM | POA: Diagnosis not present

## 2019-07-18 DIAGNOSIS — R509 Fever, unspecified: Secondary | ICD-10-CM | POA: Diagnosis not present

## 2019-07-18 DIAGNOSIS — R5383 Other fatigue: Secondary | ICD-10-CM | POA: Diagnosis not present

## 2019-07-18 DIAGNOSIS — Z7951 Long term (current) use of inhaled steroids: Secondary | ICD-10-CM | POA: Diagnosis not present

## 2019-07-22 DIAGNOSIS — U071 COVID-19: Secondary | ICD-10-CM | POA: Diagnosis not present

## 2019-08-06 IMAGING — DX LUMBAR SPINE - COMPLETE 4+ VIEW
5 series · 5 of 5 positions shown · non-contrast
Comparison: None.

CLINICAL DATA: Low back pain following twisting type injury

EXAM:
LUMBAR SPINE - COMPLETE 4+ VIEW

[l-spine ap]
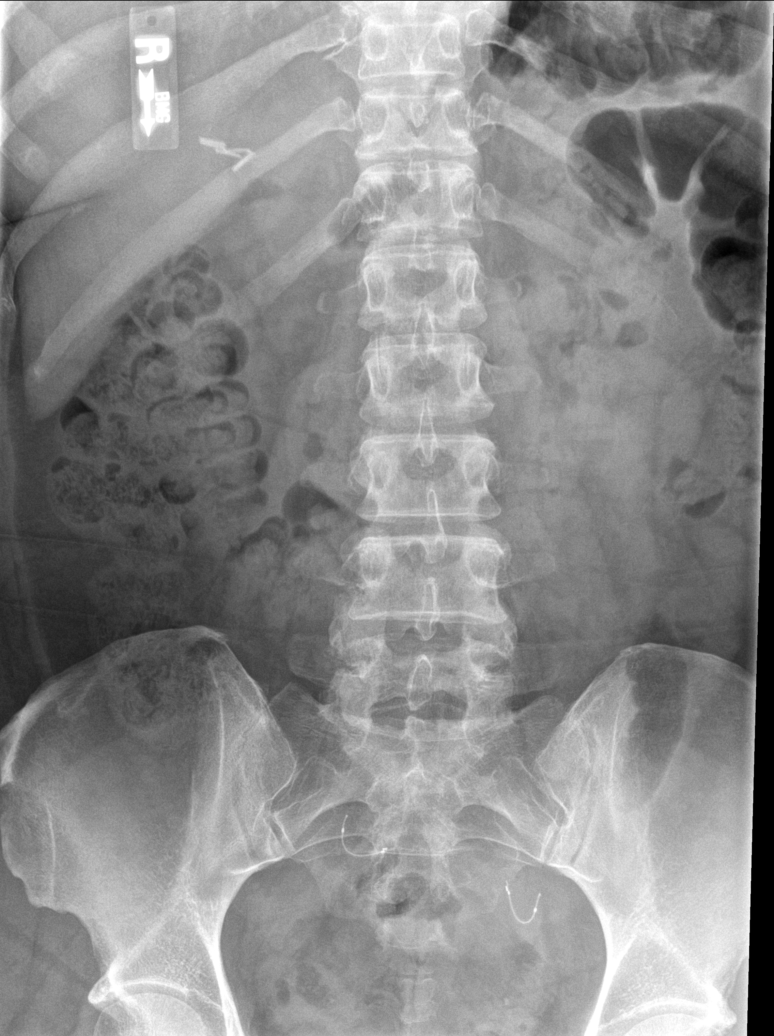

[l-spine obl (1 of 2)]
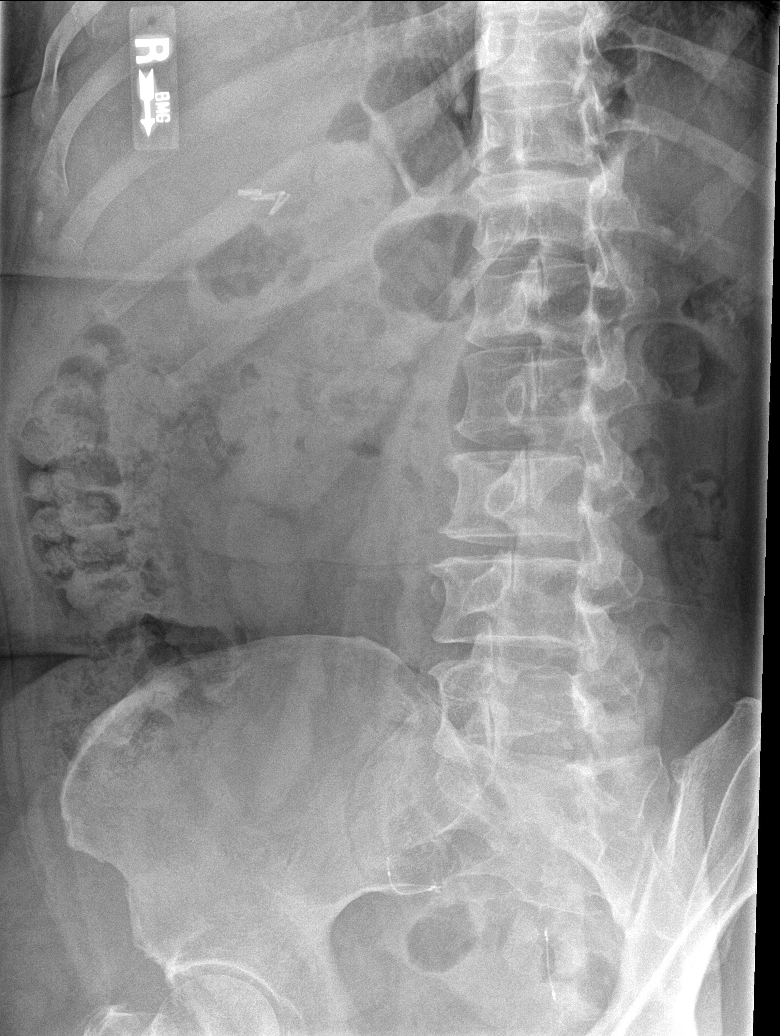

[l-spine obl (2 of 2)]
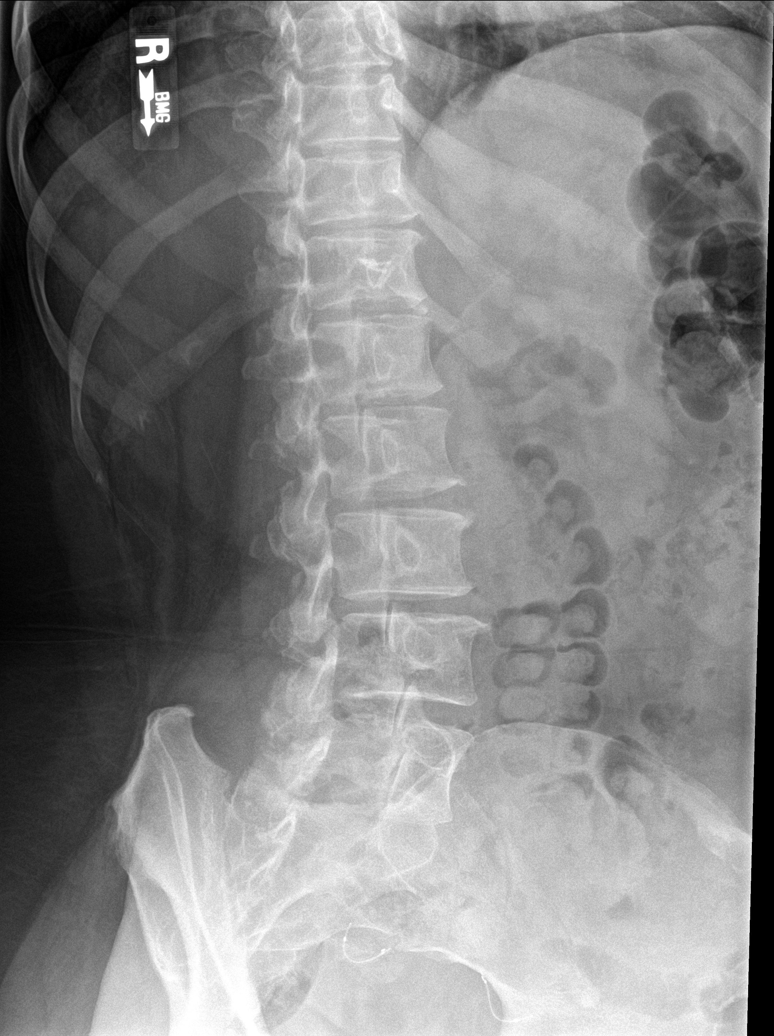

[l-spine lat]
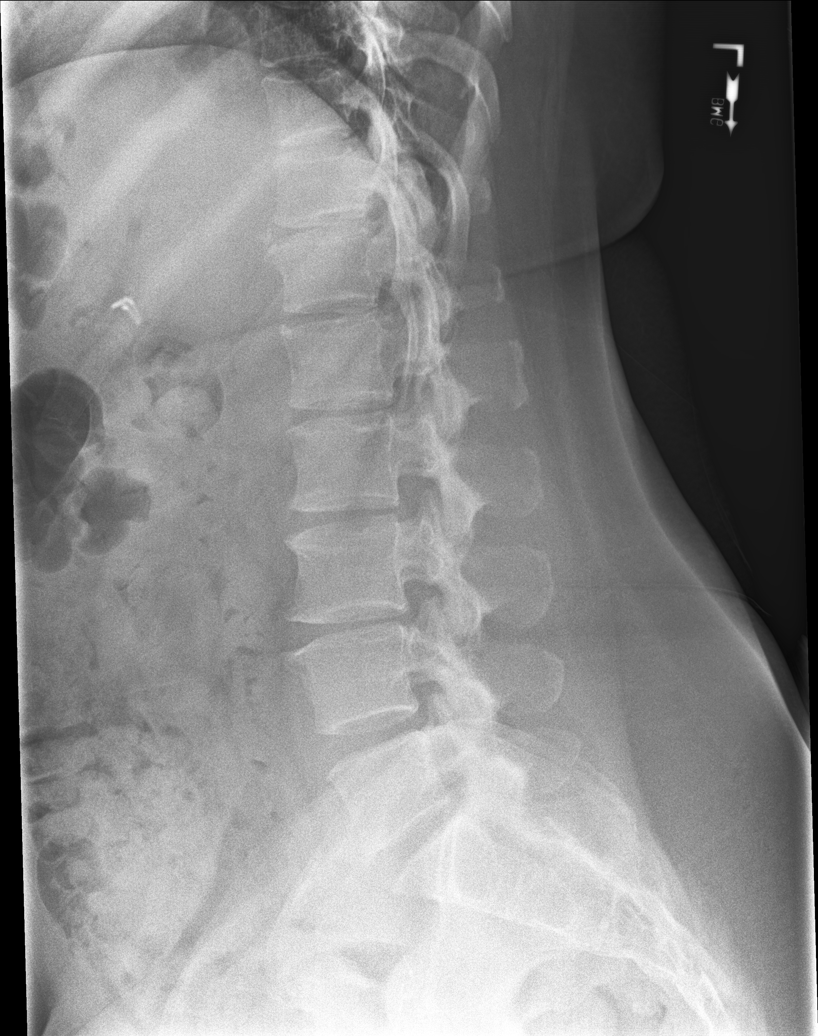

[l-spine l5-s1]
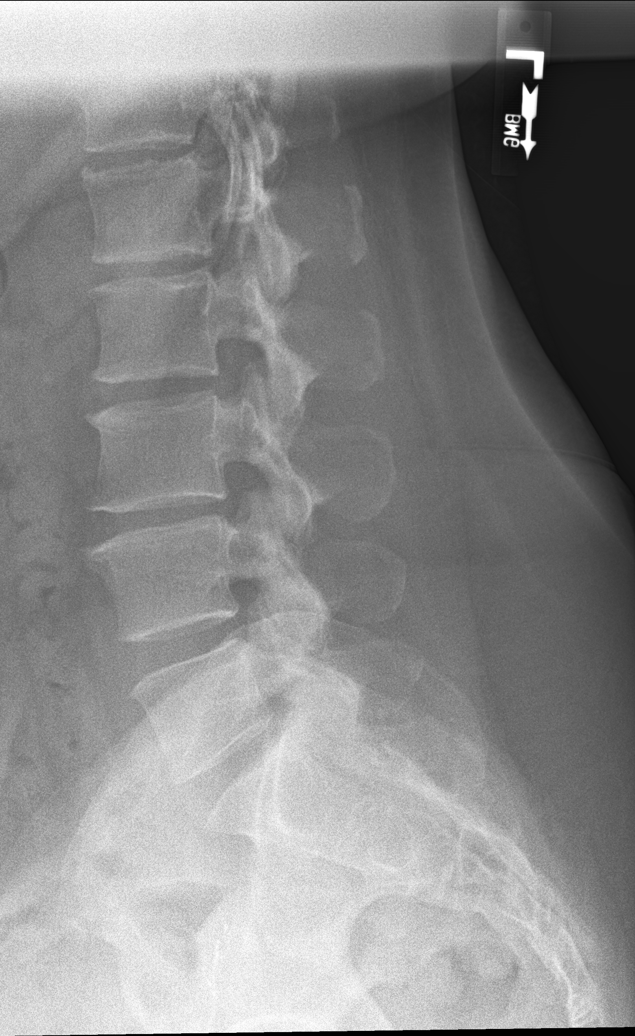

[5 of 5 positions shown; findings below may reference images not displayed]

FINDINGS: Frontal, lateral, spot lumbosacral lateral, and bilateral oblique
views were obtained. There are 5 non-rib-bearing lumbar type
vertebral bodies. There is no fracture or spondylolisthesis. There
is mild disc space narrowing at L3-4. Other disc spaces appear
unremarkable. There is facet osteoarthritic change at L5-S1
bilaterally. There are Essure fallopian tube stents bilaterally.
IMPRESSION: Areas of relatively mild osteoarthritic change. No fracture or
spondylolisthesis.

## 2019-08-19 ENCOUNTER — Other Ambulatory Visit: Payer: Self-pay | Admitting: Allergy & Immunology

## 2019-10-11 ENCOUNTER — Other Ambulatory Visit: Payer: Self-pay | Admitting: Allergy & Immunology

## 2019-10-12 ENCOUNTER — Telehealth: Payer: Self-pay

## 2019-10-12 NOTE — Telephone Encounter (Signed)
Lm for pt to call us back  Received a request for refill of albuterol pt has not been seen in over a year so to get refill pt needs an office visit

## 2019-12-08 DIAGNOSIS — Z124 Encounter for screening for malignant neoplasm of cervix: Secondary | ICD-10-CM | POA: Diagnosis not present

## 2019-12-08 DIAGNOSIS — Z1151 Encounter for screening for human papillomavirus (HPV): Secondary | ICD-10-CM | POA: Diagnosis not present

## 2019-12-08 DIAGNOSIS — Z6836 Body mass index (BMI) 36.0-36.9, adult: Secondary | ICD-10-CM | POA: Diagnosis not present

## 2019-12-08 DIAGNOSIS — Z13220 Encounter for screening for lipoid disorders: Secondary | ICD-10-CM | POA: Diagnosis not present

## 2019-12-08 DIAGNOSIS — Z01419 Encounter for gynecological examination (general) (routine) without abnormal findings: Secondary | ICD-10-CM | POA: Diagnosis not present

## 2019-12-08 DIAGNOSIS — Z0000 Encounter for general adult medical examination without abnormal findings: Secondary | ICD-10-CM | POA: Diagnosis not present

## 2020-01-22 DIAGNOSIS — Z1231 Encounter for screening mammogram for malignant neoplasm of breast: Secondary | ICD-10-CM | POA: Diagnosis not present

## 2020-02-09 DIAGNOSIS — N6489 Other specified disorders of breast: Secondary | ICD-10-CM | POA: Diagnosis not present

## 2020-02-09 DIAGNOSIS — R928 Other abnormal and inconclusive findings on diagnostic imaging of breast: Secondary | ICD-10-CM | POA: Diagnosis not present

## 2020-04-04 ENCOUNTER — Other Ambulatory Visit: Payer: Self-pay | Admitting: Allergy & Immunology

## 2020-05-30 DIAGNOSIS — Z20822 Contact with and (suspected) exposure to covid-19: Secondary | ICD-10-CM | POA: Diagnosis not present

## 2020-05-30 DIAGNOSIS — Z20828 Contact with and (suspected) exposure to other viral communicable diseases: Secondary | ICD-10-CM | POA: Diagnosis not present

## 2020-06-17 DIAGNOSIS — Z6841 Body Mass Index (BMI) 40.0 and over, adult: Secondary | ICD-10-CM | POA: Diagnosis not present

## 2020-06-17 DIAGNOSIS — Z79899 Other long term (current) drug therapy: Secondary | ICD-10-CM | POA: Diagnosis not present

## 2020-06-17 DIAGNOSIS — Z131 Encounter for screening for diabetes mellitus: Secondary | ICD-10-CM | POA: Diagnosis not present

## 2020-06-17 DIAGNOSIS — Z13220 Encounter for screening for lipoid disorders: Secondary | ICD-10-CM | POA: Diagnosis not present

## 2020-06-28 ENCOUNTER — Other Ambulatory Visit: Payer: Self-pay | Admitting: Allergy & Immunology

## 2020-07-05 DIAGNOSIS — Z713 Dietary counseling and surveillance: Secondary | ICD-10-CM | POA: Diagnosis not present

## 2020-07-05 DIAGNOSIS — Z6839 Body mass index (BMI) 39.0-39.9, adult: Secondary | ICD-10-CM | POA: Diagnosis not present

## 2020-08-02 DIAGNOSIS — Z6837 Body mass index (BMI) 37.0-37.9, adult: Secondary | ICD-10-CM | POA: Diagnosis not present

## 2020-08-02 DIAGNOSIS — E669 Obesity, unspecified: Secondary | ICD-10-CM | POA: Diagnosis not present

## 2020-08-10 DIAGNOSIS — R922 Inconclusive mammogram: Secondary | ICD-10-CM | POA: Diagnosis not present

## 2020-08-10 DIAGNOSIS — R928 Other abnormal and inconclusive findings on diagnostic imaging of breast: Secondary | ICD-10-CM | POA: Diagnosis not present

## 2020-08-10 DIAGNOSIS — N6321 Unspecified lump in the left breast, upper outer quadrant: Secondary | ICD-10-CM | POA: Diagnosis not present

## 2020-09-23 DIAGNOSIS — E669 Obesity, unspecified: Secondary | ICD-10-CM | POA: Diagnosis not present

## 2020-09-23 DIAGNOSIS — Z6835 Body mass index (BMI) 35.0-35.9, adult: Secondary | ICD-10-CM | POA: Diagnosis not present

## 2020-11-15 DIAGNOSIS — Z79899 Other long term (current) drug therapy: Secondary | ICD-10-CM | POA: Diagnosis not present

## 2020-11-15 DIAGNOSIS — J45901 Unspecified asthma with (acute) exacerbation: Secondary | ICD-10-CM | POA: Diagnosis not present

## 2020-11-15 DIAGNOSIS — R059 Cough, unspecified: Secondary | ICD-10-CM | POA: Diagnosis not present

## 2020-11-15 DIAGNOSIS — Z883 Allergy status to other anti-infective agents status: Secondary | ICD-10-CM | POA: Diagnosis not present

## 2020-11-15 DIAGNOSIS — R0602 Shortness of breath: Secondary | ICD-10-CM | POA: Diagnosis not present

## 2020-11-17 ENCOUNTER — Ambulatory Visit (INDEPENDENT_AMBULATORY_CARE_PROVIDER_SITE_OTHER): Payer: BC Managed Care – PPO | Admitting: Allergy & Immunology

## 2020-11-17 ENCOUNTER — Encounter: Payer: Self-pay | Admitting: Allergy & Immunology

## 2020-11-17 ENCOUNTER — Other Ambulatory Visit: Payer: Self-pay

## 2020-11-17 VITALS — BP 110/70 | HR 98 | Temp 98.1°F | Resp 16 | Ht 65.0 in | Wt 216.0 lb

## 2020-11-17 DIAGNOSIS — J3089 Other allergic rhinitis: Secondary | ICD-10-CM | POA: Diagnosis not present

## 2020-11-17 DIAGNOSIS — J4540 Moderate persistent asthma, uncomplicated: Secondary | ICD-10-CM | POA: Diagnosis not present

## 2020-11-17 DIAGNOSIS — J302 Other seasonal allergic rhinitis: Secondary | ICD-10-CM | POA: Diagnosis not present

## 2020-11-17 NOTE — Progress Notes (Signed)
FOLLOW UP  Date of Service/Encounter:  11/17/20   Assessment:   Moderate persistent asthma with worsening control since her recent COVID19 infection  Absolute eosinophil count of 300 in June 2022   Seasonal and perennial allergic rhinitis (trees, grasses and dust mites)  Plan/Recommendations:   1. Mild persistent asthma, uncomplicated - Lung testing looked good today likely because of the prednisone.  - Stop the Arnuity and start Trelegy one puff once daily (copay card provided).  - This might be a temporary change.  - Daily controller medication(s): Singulair 10mg  daily and Trelegy one puff once daily - Prior to physical activity: ProAir 2 puffs 10-15 minutes before physical activity. - Rescue medications: ProAir 4 puffs every 4-6 hours as needed OR albuterol nebulizer solution once treatment every 4 hours as needed. - Asthma control goals: * Full participation in all desired activities (may need albuterol before activity) * Albuterol use two time or less a week on average (not counting use with activity) * Cough interfering with sleep two time or less a month * Oral steroids no more than once a year * No hospitalizations    2. Chronic rhinitis (trees, grasses and dust mites) - This all seems to be stable.  3. Return in about 3 months (around 02/17/2021).   Subjective:   Chelsea Bryant is a 45 y.o. female presenting today for follow up of  Chief Complaint  Patient presents with   Asthma    Chelsea Bryant has a history of the following: Patient Active Problem List   Diagnosis Date Noted   Frequent urination 12/03/2018   Chronic right-sided back pain 12/03/2018   Mild persistent asthma, uncomplicated 04/05/2017   Seasonal and perennial allergic rhinitis 04/05/2017   Nausea without vomiting 09/05/2016   Mild intermittent asthma with acute exacerbation 09/05/2016   Cough 09/05/2016   Asthma, moderate persistent, well-controlled 03/03/2015    History  obtained from: chart review and patient.  Chelsea Bryant is a 45 y.o. female presenting for a follow up visit.  She was last seen in March 2020.  At that time, and would like to assume a good.  We continued with Qvar 80 mcg 2 puffs twice daily as well as Singulair 10 mg daily.  She will primarily use during periods of respiratory distress.  For her rhinitis, she continued on Singulair 10 mg daily and nasal saline rinses as needed.  She had recently gotten married.  Since the last visit, she had two rounds of COVID in 2021 and then one month ago. She  Asthma/Respiratory Symptom History: She remains on the Arnuity one puff once daily. She is getting 3 months for $100. She was dong fairly well on this regimen. She is having issues with triggers that she never had previously.  She thinks a lot of her uncontrolled asthma at this point is related to her COVID infections.  She was fully vaccinated for the second infection and while her symptoms were severe, the shortness of breath has persisted.  ACT control is 13, indicating poor asthma control.  She was recently seen in the ED a couple days ago.  She is unsure what triggered her asthma attack this time, as this was about 1 month out since her last COVID infection.  In the ER, she was placed on prednisone for two weeks or so. She got three breathing treatments.  She felt fairly good by the time she left.   Allergic Rhinitis Symptom History: Since her septoplasty, she has had no  symptoms of allergic rhinitis.  She is not using any nasal sprays at all per ENT recommendations.  She was vaccinated around the end of 2021. 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 She is now down to 216 from the 250. She is on Qsymia.  Otherwise, there have been no changes to her past medical history, surgical history, family history, or social  history.    Review of Systems  Constitutional:  Positive for weight loss (intentional). Negative for chills, fever and malaise/fatigue.  HENT:  Negative for congestion, ear discharge, ear pain and sinus pain.   Eyes:  Negative for pain, discharge and redness.  Respiratory:  Positive for cough, shortness of breath and wheezing. Negative for sputum production.   Cardiovascular: Negative.  Negative for chest pain and palpitations.  Gastrointestinal:  Negative for abdominal pain, constipation, diarrhea, heartburn, nausea and vomiting.  Skin: Negative.  Negative for itching and rash.  Neurological:  Negative for dizziness and headaches.  Endo/Heme/Allergies:  Negative for environmental allergies. Does not bruise/bleed easily.      Objective:   Blood pressure 110/70, pulse 98, temperature 98.1 F (36.7 C), temperature source Temporal, resp. rate 16, height 5\' 5"  (1.651 m), weight 216 lb (98 kg), SpO2 98 %. Body mass index is 35.94 kg/m.   Physical Exam:  Physical Exam Vitals reviewed.  Constitutional:      Appearance: She is well-developed.     Comments: Very pleasant female.  Talkative.  HENT:     Head: Normocephalic and atraumatic.     Right Ear: Tympanic membrane, ear canal and external ear normal.     Left Ear: Tympanic membrane, ear canal and external ear normal.     Nose: No nasal deformity, septal deviation, mucosal edema or rhinorrhea.     Right Turbinates: Enlarged and swollen.     Left Turbinates: Enlarged and swollen.     Right Sinus: No maxillary sinus tenderness or frontal sinus tenderness.     Left Sinus: No maxillary sinus tenderness or frontal sinus tenderness.     Mouth/Throat:     Mouth: Mucous membranes are not pale and not dry.     Pharynx: Uvula midline.  Eyes:     General: Lids are normal. No allergic shiner.       Right eye: No discharge.        Left eye: No discharge.     Conjunctiva/sclera: Conjunctivae normal.     Right eye: Right conjunctiva is  not injected. No chemosis.    Left eye: Left conjunctiva is not injected. No chemosis.    Pupils: Pupils are equal, round, and reactive to light.  Cardiovascular:     Rate and Rhythm: Normal rate and regular rhythm.     Heart sounds: Normal heart sounds.  Pulmonary:     Effort: Pulmonary effort is normal. No tachypnea, accessory muscle usage or respiratory distress.     Breath sounds: Normal breath sounds. No wheezing, rhonchi or rales.     Comments: Moving air well in all lung fields.  No increased work of breathing. Chest:     Chest wall: No tenderness.  Lymphadenopathy:     Cervical: No cervical adenopathy.  Skin:    General: Skin is warm.     Capillary Refill: Capillary refill takes less than 2 seconds.     Coloration:  Skin is not pale.     Findings: No abrasion, erythema, petechiae or rash. Rash is not papular, urticarial or vesicular.     Comments: No eczematous or urticarial lesions noted.  Neurological:     Mental Status: She is alert.  Psychiatric:        Behavior: Behavior is cooperative.     Diagnostic studies:    Spirometry: results normal (FEV1: 2.18/74%, FVC: 2.91/80%, FEV1/FVC: 75%).    Spirometry consistent with normal pattern.   Allergy Studies: none       Malachi Bonds, MD  [facility]

## 2020-11-17 NOTE — Patient Instructions (Addendum)
1. Mild persistent asthma, uncomplicated - Lung testing looked good today likely because of the prednisone.  - Stop the Arnuity and start Trelegy one puff once daily (copay card provided).  - This might be a temporary change.  - Daily controller medication(s): Singulair 10mg  daily and Trelegy one puff once daily - Prior to physical activity: ProAir 2 puffs 10-15 minutes before physical activity. - Rescue medications: ProAir 4 puffs every 4-6 hours as needed OR albuterol nebulizer solution once treatment every 4 hours as needed. - Asthma control goals: * Full participation in all desired activities (may need albuterol before activity) * Albuterol use two time or less a week on average (not counting use with activity) * Cough interfering with sleep two time or less a month * Oral steroids no more than once a year * No hospitalizations    2. Chronic rhinitis (trees, grasses and dust mites) - This all seems to be stable.  3. Return in about 3 months (around 02/17/2021).    Please inform 02/19/2021 of any Emergency Department visits, hospitalizations, or changes in symptoms. Call us before going to the ED for breathing or allergy symptoms since we might be able to fit you in for a sick visit. Feel free to contact us anytime with any questions, problems, or concerns.  It was a pleasure to see you again today!  Websites that have reliable patient information: 1. American Academy of Asthma, Allergy, and Immunology: www.aaaai.org 2. Food Allergy Research and Education (FARE): foodallergy.org 3. Mothers of Asthmatics: http://www.asthmacommunitynetwork.org 4. American College of Allergy, Asthma, and Immunology: www.acaai.org   COVID-19 Vaccine Information can be found at: [place] For questions related to vaccine distribution or appointments, please email [email or web address] or call [phone].   We realize that you might be  concerned about having an allergic reaction to the COVID19 vaccines. To help with that concern, WE ARE OFFERING THE COVID19 VACCINES IN OUR OFFICE! Ask the front desk for dates!     "Like" [phone] on Facebook and Instagram for our latest updates!      A healthy democracy works best when [place] participate! Make sure you are registered to vote! If you have moved or changed any of your contact information, you will need to get this updated before voting!  In some cases, you MAY be able to register to vote online: [employer]

## 2020-11-18 MED ORDER — TRELEGY ELLIPTA 200-62.5-25 MCG/INH IN AEPB: INHALATION_SPRAY | RESPIRATORY_TRACT | 1 refills | Status: DC

## 2020-11-18 MED ORDER — ALBUTEROL SULFATE HFA 108 (90 BASE) MCG/ACT IN AERS: 2.0000 | INHALATION_SPRAY | RESPIRATORY_TRACT | 0 refills | Status: DC | PRN

## 2020-11-18 MED ORDER — ALBUTEROL SULFATE (2.5 MG/3ML) 0.083% IN NEBU: 2.5000 mg | INHALATION_SOLUTION | Freq: Four times a day (QID) | RESPIRATORY_TRACT | 0 refills | Status: DC | PRN

## 2020-11-19 ENCOUNTER — Encounter: Payer: Self-pay | Admitting: Allergy & Immunology

## 2020-11-24 DIAGNOSIS — E669 Obesity, unspecified: Secondary | ICD-10-CM | POA: Diagnosis not present

## 2020-11-24 DIAGNOSIS — Z6835 Body mass index (BMI) 35.0-35.9, adult: Secondary | ICD-10-CM | POA: Diagnosis not present

## 2021-02-09 DIAGNOSIS — N6002 Solitary cyst of left breast: Secondary | ICD-10-CM | POA: Diagnosis not present

## 2021-02-09 DIAGNOSIS — R922 Inconclusive mammogram: Secondary | ICD-10-CM | POA: Diagnosis not present

## 2021-02-09 DIAGNOSIS — R928 Other abnormal and inconclusive findings on diagnostic imaging of breast: Secondary | ICD-10-CM | POA: Diagnosis not present

## 2021-02-28 DIAGNOSIS — E669 Obesity, unspecified: Secondary | ICD-10-CM | POA: Diagnosis not present

## 2021-02-28 DIAGNOSIS — Z6834 Body mass index (BMI) 34.0-34.9, adult: Secondary | ICD-10-CM | POA: Diagnosis not present

## 2021-08-24 ENCOUNTER — Ambulatory Visit: Payer: BC Managed Care – PPO | Admitting: Allergy & Immunology

## 2021-08-24 ENCOUNTER — Encounter: Payer: Self-pay | Admitting: Allergy & Immunology

## 2021-08-24 VITALS — BP 138/88 | HR 90 | Temp 97.6°F | Resp 18 | Ht 66.0 in | Wt 214.4 lb

## 2021-08-24 DIAGNOSIS — J4540 Moderate persistent asthma, uncomplicated: Secondary | ICD-10-CM

## 2021-08-24 DIAGNOSIS — J3089 Other allergic rhinitis: Secondary | ICD-10-CM

## 2021-08-24 DIAGNOSIS — J302 Other seasonal allergic rhinitis: Secondary | ICD-10-CM

## 2021-08-24 MED ORDER — ALBUTEROL SULFATE HFA 108 (90 BASE) MCG/ACT IN AERS: 2.0000 | INHALATION_SPRAY | RESPIRATORY_TRACT | 0 refills | Status: DC | PRN

## 2021-08-24 MED ORDER — TRELEGY ELLIPTA 200-62.5-25 MCG/ACT IN AEPB: 1.0000 | INHALATION_SPRAY | Freq: Every day | RESPIRATORY_TRACT | 3 refills | Status: AC

## 2021-08-24 NOTE — Progress Notes (Signed)
? ?FOLLOW UP ? ?Date of Service/Encounter:  08/24/21 ? ? ?Assessment:  ? ?Moderate persistent asthma without complication  ?  ?Seasonal and perennial allergic rhinitis (trees, grasses and dust mites) ?  ?S/p septoplasty ? ?Plan/Recommendations:  ? ?1. Mild persistent asthma, uncomplicated ?- Lung testing looked pretty good all things considered.  ?- We will get you back on track with prednisone and we will restart the Trelegy.   ?- Daily controller medication(s): Trelegy one puff once daily ?- Prior to physical activity: ProAir 2 puffs 10-15 minutes before physical activity. ?- Rescue medications: ProAir 4 puffs every 4-6 hours as needed OR albuterol nebulizer solution once treatment every 4 hours as needed. ?- Asthma control goals: ?* Full participation in all desired activities (may need albuterol before activity) ?* Albuterol use two time or less a week on average (not counting use with activity) ?* Cough interfering with sleep two time or less a month ?* Oral steroids no more than once a year ?* No hospitalizations  ?  ?2. Chronic rhinitis (trees, grasses and dust mites) ?- This all seems to be stable. ? ?3. Return in about 6 months (around 02/23/2022).  ? ? ?Subjective:  ? ?Chelsea Bryant is a 46 y.o. female presenting today for follow up of  ?Chief Complaint  ?Patient presents with  ? Asthma  ?  Not good  ? ? ?Chelsea Bryant has a history of the following: ?Patient Active Problem List  ? Diagnosis Date Noted  ? Frequent urination 12/03/2018  ? Chronic right-sided back pain 12/03/2018  ? Mild persistent asthma, uncomplicated 04/05/2017  ? Seasonal and perennial allergic rhinitis 04/05/2017  ? Nausea without vomiting 09/05/2016  ? Mild intermittent asthma with acute exacerbation 09/05/2016  ? Cough 09/05/2016  ? Asthma, moderate persistent, well-controlled 03/03/2015  ? ? ?History obtained from: chart review and patient. ? ?Chelsea Bryant is a 46 y.o. female presenting for a follow up visit.  We last saw  her in July 2022.  At that time, her lung testing looked good because she was on prednisone.  We stopped Arnuity and started Trelegy 1 puff once daily.  We provided her with a co-pay card.  We also continue with the Singulair 10 mg daily and albuterol as needed.  For her rhinitis, everything seems to be very stable. ? ?Since last visit, she has largely done well.  However, she is delinquent on her visits and therefore was unable to get her medications.  This is what brought her in today. ? ?Asthma/Respiratory Symptom History: She was using Trelegy and Singulair with good success . She ran out of her daily medication one month ago.  When she was on medications, they are working very well.  She has been more tight but had some difficulty breathing since being on the medications.  With unclear why we could not send in a temporary supply of medications for her, but I guess that is neither here nor there at this point.  Her symptoms were under good control.  She would just like to continue with the medication.  The addition of the Trelegy seems to have been very helpful. ? ?Allergic Rhinitis Symptom History: Allergies have been well controlled . She is good on the montelukast.  She has not been using any breakthrough medications including nasal sprays or antihistamines.  She had her nasal surgery which is actually been very successful in making her symptoms better. ? ?She has had a lot of social issues with her son and  mother-in-law.  She has had a number of surgeries and has been replacing pipes as well.  She continues to work at [employer] most her job there, but its been hard for her to get reliable help.  She has 2 or 3 employees that are fairly consistent. ? ?Otherwise, there have been no changes to her past medical history, surgical history, family history, or social history. ? ? ? ?Review of Systems  ?Constitutional: Negative.  Negative for chills, fever, malaise/fatigue and weight loss.  ?HENT: Negative.  Negative  for congestion, ear discharge, ear pain and sinus pain.   ?Eyes:  Negative for pain, discharge and redness.  ?Respiratory:  Negative for cough, sputum production, shortness of breath and wheezing.   ?Cardiovascular: Negative.  Negative for chest pain and palpitations.  ?Gastrointestinal:  Negative for abdominal pain, constipation, diarrhea, heartburn, nausea and vomiting.  ?Skin: Negative.  Negative for itching and rash.  ?Neurological:  Negative for dizziness and headaches.  ?Endo/Heme/Allergies:  Negative for environmental allergies. Does not bruise/bleed easily.   ? ? ? ?Objective:  ? ?Blood pressure 138/88, pulse 90, temperature 97.6 ?F (36.4 ?C), resp. rate 18, height 5\' 6"  (1.676 m), weight 214 lb 6.4 oz (97.3 kg), SpO2 99 %. ?Body mass index is 34.61 kg/m?. ? ? ? ?Physical Exam ?Vitals reviewed.  ?Constitutional:   ?   Appearance: She is well-developed.  ?   Comments: Very pleasant female.  Talkative.  ?HENT:  ?   Head: Normocephalic and atraumatic.  ?   Right Ear: Tympanic membrane, ear canal and external ear normal.  ?   Left Ear: Tympanic membrane, ear canal and external ear normal.  ?   Nose: No nasal deformity, septal deviation, mucosal edema or rhinorrhea.  ?   Right Turbinates: Enlarged, swollen and pale.  ?   Left Turbinates: Enlarged, swollen and pale.  ?   Right Sinus: No maxillary sinus tenderness or frontal sinus tenderness.  ?   Left Sinus: No maxillary sinus tenderness or frontal sinus tenderness.  ?   Comments: No nasal polyps. ?   Mouth/Throat:  ?   Mouth: Mucous membranes are not pale and not dry.  ?   Pharynx: Uvula midline.  ?Eyes:  ?   General: Lids are normal. No allergic shiner.    ?   Right eye: No discharge.     ?   Left eye: No discharge.  ?   Conjunctiva/sclera: Conjunctivae normal.  ?   Right eye: Right conjunctiva is not injected. No chemosis. ?   Left eye: Left conjunctiva is not injected. No chemosis. ?   Pupils: Pupils are equal, round, and reactive to light.  ?Cardiovascular:   ?   Rate and Rhythm: Normal rate and regular rhythm.  ?   Heart sounds: Normal heart sounds.  ?Pulmonary:  ?   Effort: Pulmonary effort is normal. No tachypnea, accessory muscle usage or respiratory distress.  ?   Breath sounds: Normal breath sounds. No wheezing, rhonchi or rales.  ?   Comments: Moving air well in all lung fields.  No increased work of breathing. ?Chest:  ?   Chest wall: No tenderness.  ?Lymphadenopathy:  ?   Cervical: No cervical adenopathy.  ?Skin: ?   General: Skin is warm.  ?   Capillary Refill: Capillary refill takes less than 2 seconds.  ?   Coloration: Skin is not pale.  ?   Findings: No abrasion, erythema, petechiae or rash. Rash is not papular, urticarial or vesicular.  ?  Comments: No eczematous or urticarial lesions noted.  ?Neurological:  ?   Mental Status: She is alert.  ?Psychiatric:     ?   Behavior: Behavior is cooperative.  ?  ? ?Diagnostic studies:   ? ?Spirometry: results abnormal (FEV1: 2.03L, FVC: 2.67L, FEV1/FVC: 76%).  ?  ?Spirometry consistent with possible restrictive disease.  Overall, values are slightly lower than previous. ? ?Allergy Studies: none ? ? ? ? ? ?  ?Malachi Bonds, MD  ?[facility] ? ? ? ? ? ? ?

## 2021-08-24 NOTE — Patient Instructions (Addendum)
1. Mild persistent asthma, uncomplicated ?- Lung testing looked pretty good all things considered.  ?- We will get you back on track with prednisone and we will restart the Trelegy.   ?- Daily controller medication(s): Trelegy 237mcg one puff once daily ?- Prior to physical activity: ProAir 2 puffs 10-15 minutes before physical activity. ?- Rescue medications: ProAir 4 puffs every 4-6 hours as needed OR albuterol nebulizer solution once treatment every 4 hours as needed. ?- Asthma control goals: ?* Full participation in all desired activities (may need albuterol before activity) ?* Albuterol use two time or less a week on average (not counting use with activity) ?* Cough interfering with sleep two time or less a month ?* Oral steroids no more than once a year ?* No hospitalizations  ?  ?2. Chronic rhinitis (trees, grasses and dust mites) ?- This all seems to be stable. ? ?3. Return in about 6 months (around 02/23/2022).  ? ? ?Please inform us of any Emergency Department visits, hospitalizations, or changes in symptoms. Call us before going to the ED for breathing or allergy symptoms since we might be able to fit you in for a sick visit. Feel free to contact us anytime with any questions, problems, or concerns. ? ?It was a pleasure to see you again today! ? ?Websites that have reliable patient information: ?1. American Academy of Asthma, Allergy, and Immunology: www.aaaai.org ?2. Food Allergy Research and Education (FARE): foodallergy.org ?3. Mothers of Asthmatics: http://www.asthmacommunitynetwork.org ?4. [employer] of Allergy, Asthma, and Immunology: MonthlyElectricBill.co.uk ? ? ?COVID-19 Vaccine Information can be found at: ShippingScam.co.uk For questions related to vaccine distribution or appointments, please email [email or web address] or call [phone].  ? ?We realize that you might be concerned about having an allergic reaction to the COVID19 vaccines.  To help with that concern, WE ARE OFFERING THE COVID19 VACCINES IN OUR OFFICE! Ask the front desk for dates!  ? ? ? ??Like? [place] on Facebook and Instagram for our latest updates!  ?  ? ? ?A healthy democracy works best when [employer] participate! Make sure you are registered to vote! If you have moved or changed any of your contact information, you will need to get this updated before voting! ? ?In some cases, you MAY be able to register to vote online: [email or web address] ? ? ? ? ?

## 2021-08-30 ENCOUNTER — Encounter: Payer: Self-pay | Admitting: Allergy & Immunology

## 2022-02-27 ENCOUNTER — Other Ambulatory Visit: Payer: Self-pay

## 2022-02-27 ENCOUNTER — Ambulatory Visit: Payer: BC Managed Care – PPO | Admitting: Allergy & Immunology

## 2022-02-27 ENCOUNTER — Encounter: Payer: Self-pay | Admitting: Allergy & Immunology

## 2022-02-27 VITALS — BP 136/88 | HR 66 | Temp 97.8°F | Resp 17

## 2022-02-27 DIAGNOSIS — J4540 Moderate persistent asthma, uncomplicated: Secondary | ICD-10-CM

## 2022-02-27 DIAGNOSIS — J3089 Other allergic rhinitis: Secondary | ICD-10-CM

## 2022-02-27 DIAGNOSIS — J302 Other seasonal allergic rhinitis: Secondary | ICD-10-CM

## 2022-02-27 MED ORDER — TRELEGY ELLIPTA 200-62.5-25 MCG/ACT IN AEPB: 1.0000 | INHALATION_SPRAY | Freq: Every day | RESPIRATORY_TRACT | 2 refills | Status: AC

## 2022-02-27 NOTE — Patient Instructions (Addendum)
1. Mild persistent asthma, uncomplicated - Savings card (you have to download it onto your phone): https://www.trelegy.com/savings-and-coupons/ - Lung testing looked pretty good all things considered.  - We will get you back on track with prednisone and we will restart the Trelegy.   - Daily controller medication(s): Trelegy 256mcg one puff once daily - Prior to physical activity: ProAir 2 puffs 10-15 minutes before physical activity. - Rescue medications: ProAir 4 puffs every 4-6 hours as needed OR albuterol nebulizer solution once treatment every 4 hours as needed. - Asthma control goals: * Full participation in all desired activities (may need albuterol before activity) * Albuterol use two time or less a week on average (not counting use with activity) * Cough interfering with sleep two time or less a month * Oral steroids no more than once a year * No hospitalizations    2. Chronic rhinitis (trees, grasses and dust mites) - This all seems to be stable.  3. Return in about 6 months (around 08/29/2022).    Please inform us of any Emergency Department visits, hospitalizations, or changes in symptoms. Call us before going to the ED for breathing or allergy symptoms since we might be able to fit you in for a sick visit. Feel free to contact us anytime with any questions, problems, or concerns.  It was a pleasure to see you again today!  Websites that have reliable patient information: 1. American Academy of Asthma, Allergy, and Immunology: www.aaaai.org 2. Food Allergy Research and Education (FARE): foodallergy.org 3. Mothers of Asthmatics: http://www.asthmacommunitynetwork.org 4. American College of Allergy, Asthma, and Immunology: www.acaai.org   COVID-19 Vaccine Information can be found at: ShippingScam.co.uk For questions related to vaccine distribution or appointments, please email [email or web address] or call  [phone].   We realize that you might be concerned about having an allergic reaction to the COVID19 vaccines. To help with that concern, WE ARE OFFERING THE COVID19 VACCINES IN OUR OFFICE! Ask the front desk for dates!     "Like" [place] on Facebook and Instagram for our latest updates!      A healthy democracy works best when [employer] participate! Make sure you are registered to vote! If you have moved or changed any of your contact information, you will need to get this updated before voting!  In some cases, you MAY be able to register to vote online: [email or web address]

## 2022-02-27 NOTE — Progress Notes (Signed)
FOLLOW UP  Date of Service/Encounter:  02/27/22   Assessment:   Moderate persistent asthma without complication - doing extremely well on Trelegy   Seasonal and perennial allergic rhinitis (trees, grasses and dust mites)   S/p septoplasty - doing well since that time  Plan/Recommendations:   1. Mild persistent asthma, uncomplicated - Savings card (you have to download it onto your phone): https://www.trelegy.com/savings-and-coupons/ - Lung testing looked pretty good all things considered.  - We will get you back on track with prednisone and we will restart the Trelegy.   - Daily controller medication(s): Trelegy 243mcg one puff once daily - Prior to physical activity: ProAir 2 puffs 10-15 minutes before physical activity. - Rescue medications: ProAir 4 puffs every 4-6 hours as needed OR albuterol nebulizer solution once treatment every 4 hours as needed. - Asthma control goals: * Full participation in all desired activities (may need albuterol before activity) * Albuterol use two time or less a week on average (not counting use with activity) * Cough interfering with sleep two time or less a month * Oral steroids no more than once a year * No hospitalizations    2. Chronic rhinitis (trees, grasses and dust mites) - This all seems to be stable.  3. Return in about 6 months (around 08/29/2022).     Subjective:   Chelsea Bryant is a 46 y.o. female presenting today for follow up of  Chief Complaint  Patient presents with   Asthma    Chelsea Bryant has a history of the following: Patient Active Problem List   Diagnosis Date Noted   Frequent urination 12/03/2018   Chronic right-sided back pain 12/03/2018   Mild persistent asthma, uncomplicated 60/73/7106   Seasonal and perennial allergic rhinitis 04/05/2017   Nausea without vomiting 09/05/2016   Mild intermittent asthma with acute exacerbation 09/05/2016   Cough 09/05/2016   Asthma, moderate persistent,  well-controlled 03/03/2015    History obtained from: chart review and patient.  Chelsea Bryant is a 46 y.o. female presenting for a follow up visit.  She was last seen in April 2023.  At that time, lung testing looked pretty good.  We continued with Trelegy 200 mcg 1 puff once daily and albuterol as needed.  Her rhinitis symptoms were under good control with the use of antihistamines as needed.  Her symptoms have largely improved with the nasal surgery.  Since last visit, she has largely been well.  Asthma/Respiratory Symptom History: She remains on her Trelegy and she is doing well. She does not miss a dose. She feels that she is doing well. She has her albuterol neb machine and she uses it fairly rarely. It was more frequent earlier, but this has gotten less frequent lately. 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 She has not used it in three months. She has been maintaining her weight at a 30 pound weight loss.  She has not had prednisone or ED visits for her symptoms. Symptoms really worsen during cold weather. So winter could be bad. She usually gets three months for $100 in total.   Allergic Rhinitis Symptom History: Her nose has improved. She is not allowed to use nasal sprays. She feels that her nasal passages are just dry. She is not supposed to use any nasal sprays, although she is unsure of the need to use nasal saline rinses.  She is not scheduled to see ENT again. She largely does not  have problems. She bends over and feels it in her sinuses.  She is doing well with regards to her work.  She continues to work at [employer].  She apparently is fully staffed, but she reports that they are not the greatest.  But at least they show up to work.  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  Otherwise, there have been no changes to her past medical history, surgical history, family history, or social history.    Review of Systems  Constitutional:  Negative.  Negative for fever, malaise/fatigue and weight loss.  HENT: Negative.  Negative for congestion, ear discharge and ear pain.   Eyes:  Negative for pain, discharge and redness.  Respiratory:  Negative for cough, sputum production, shortness of breath and wheezing.   Cardiovascular: Negative.  Negative for chest pain and palpitations.  Gastrointestinal:  Negative for abdominal pain, heartburn, nausea and vomiting.  Skin: Negative.  Negative for itching and rash.  Neurological:  Negative for dizziness and headaches.  Endo/Heme/Allergies:  Negative for environmental allergies. Does not bruise/bleed easily.       Objective:   Blood pressure 136/88, pulse 66, temperature 97.8 F (36.6 C), temperature source Temporal, resp. rate 17, SpO2 100 %. There is no height or weight on file to calculate BMI.    Physical Exam Vitals reviewed.  Constitutional:      Appearance: She is well-developed.  HENT:     Head: Normocephalic and atraumatic.     Right Ear: Tympanic membrane, ear canal and external ear normal. No drainage, swelling or tenderness. Tympanic membrane is not injected, scarred, erythematous, retracted or bulging.     Left Ear: Tympanic membrane, ear canal and external ear normal. No drainage, swelling or tenderness. Tympanic membrane is not injected, scarred, erythematous, retracted or bulging.     Nose: No nasal deformity, septal deviation, mucosal edema or rhinorrhea.     Right Turbinates: Enlarged and swollen.     Left Turbinates: Enlarged and swollen.     Right Sinus: No maxillary sinus tenderness or frontal sinus tenderness.     Left Sinus: No maxillary sinus tenderness or frontal sinus tenderness.     Mouth/Throat:     Mouth: Mucous membranes are not pale and not dry.     Pharynx: Uvula midline.  Eyes:     General:        Right eye: No discharge.        Left eye: No discharge.     Conjunctiva/sclera: Conjunctivae normal.     Right eye: Right conjunctiva is not  injected. No chemosis.    Left eye: Left conjunctiva is not injected. No chemosis.    Pupils: Pupils are equal, round, and reactive to light.  Cardiovascular:     Rate and Rhythm: Normal rate and regular rhythm.     Heart sounds: Normal heart sounds.  Pulmonary:     Effort: Pulmonary effort is normal. No tachypnea, accessory muscle usage or respiratory distress.     Breath sounds: Normal breath sounds. No wheezing, rhonchi or rales.  Chest:     Chest wall: No tenderness.  Abdominal:     Tenderness: There is no abdominal tenderness. There is no guarding or rebound.  Lymphadenopathy:     Head:     Right side of head: No submandibular, tonsillar or occipital adenopathy.     Left side of head: No submandibular, tonsillar or occipital adenopathy.     Cervical: No cervical adenopathy.  Skin:    Coloration:  Skin is not pale.     Findings: No abrasion, erythema, petechiae or rash. Rash is not papular, urticarial or vesicular.  Neurological:     Mental Status: She is alert.  Psychiatric:        Behavior: Behavior is cooperative.      Diagnostic studies:    Spirometry: results normal (FEV1: 2.35/78%, FVC: 3.10/83%, FEV1/FVC: 76%).    Spirometry consistent with normal pattern. These values are actually higher than they were previously.   Allergy Studies: none       Malachi Bonds, MD  [facility]

## 2022-08-30 ENCOUNTER — Encounter: Payer: Self-pay | Admitting: Allergy & Immunology

## 2022-08-30 ENCOUNTER — Ambulatory Visit: Payer: BC Managed Care – PPO | Admitting: Allergy & Immunology

## 2022-08-30 ENCOUNTER — Other Ambulatory Visit: Payer: Self-pay

## 2022-08-30 VITALS — BP 118/78 | HR 78 | Temp 98.3°F | Resp 16 | Wt 219.2 lb

## 2022-08-30 DIAGNOSIS — J3089 Other allergic rhinitis: Secondary | ICD-10-CM

## 2022-08-30 DIAGNOSIS — J302 Other seasonal allergic rhinitis: Secondary | ICD-10-CM

## 2022-08-30 DIAGNOSIS — J4540 Moderate persistent asthma, uncomplicated: Secondary | ICD-10-CM | POA: Diagnosis not present

## 2022-08-30 MED ORDER — ALBUTEROL SULFATE HFA 108 (90 BASE) MCG/ACT IN AERS: 2.0000 | INHALATION_SPRAY | Freq: Four times a day (QID) | RESPIRATORY_TRACT | 0 refills | Status: AC | PRN

## 2022-08-30 MED ORDER — TRELEGY ELLIPTA 200-62.5-25 MCG/ACT IN AEPB: 1.0000 | INHALATION_SPRAY | Freq: Every day | RESPIRATORY_TRACT | 3 refills | Status: DC

## 2022-08-30 NOTE — Patient Instructions (Addendum)
1. Mild persistent asthma, uncomplicated - Let me know if the Trelegy copay does not work and we can make some changes. - Lung testing looked pretty good all things considered.  - Daily controller medication(s): Trelegy one puff once daily - Prior to physical activity: ProAir 2 puffs 10-15 minutes before physical activity. - Rescue medications: ProAir 4 puffs every 4-6 hours as needed OR albuterol nebulizer solution once treatment every 4 hours as needed. - Asthma control goals: * Full participation in all desired activities (may need albuterol before activity) * Albuterol use two time or less a week on average (not counting use with activity) * Cough interfering with sleep two time or less a month * Oral steroids no more than once a year * No hospitalizations    2. Chronic rhinitis (trees, grasses and dust mites) - This all seems to be stable. - Continue with the as needed use of the allergy medications.   3. Return in about 9 months (around 06/01/2023).    Please inform us of any Emergency Department visits, hospitalizations, or changes in symptoms. Call us before going to the ED for breathing or allergy symptoms since we might be able to fit you in for a sick visit. Feel free to contact us anytime with any questions, problems, or concerns.  It was a pleasure to see you again today!  Websites that have reliable patient information: 1. American Academy of Asthma, Allergy, and Immunology: www.aaaai.org 2. Food Allergy Research and Education (FARE): foodallergy.org 3. Mothers of Asthmatics: http://www.asthmacommunitynetwork.org 4. American College of Allergy, Asthma, and Immunology: www.acaai.org   COVID-19 Vaccine Information can be found at: [email or web address] For questions related to vaccine distribution or appointments, please email [email or web address] or call [phone].   We realize that you might be concerned  about having an allergic reaction to the COVID19 vaccines. To help with that concern, WE ARE OFFERING THE COVID19 VACCINES IN OUR OFFICE! Ask the front desk for dates!     "Like" [place] on Facebook and Instagram for our latest updates!      A healthy democracy works best when [employer] participate! Make sure you are registered to vote! If you have moved or changed any of your contact information, you will need to get this updated before voting!  In some cases, you MAY be able to register to vote online: [email or web address]

## 2022-08-30 NOTE — Progress Notes (Signed)
FOLLOW UP  Date of Service/Encounter:  08/30/22   Assessment:   Moderate persistent asthma without complication - doing extremely well on Trelegy   Seasonal and perennial allergic rhinitis (trees, grasses and dust mites)   S/p septoplasty - doing well since that time  Plan/Recommendations:   1. Mild persistent asthma, uncomplicated - Let me know if the Trelegy copay does not work and we can make some changes. - Lung testing looked pretty good all things considered.  - Daily controller medication(s): Trelegy one puff once daily - Prior to physical activity: ProAir 2 puffs 10-15 minutes before physical activity. - Rescue medications: ProAir 4 puffs every 4-6 hours as needed OR albuterol nebulizer solution once treatment every 4 hours as needed. - Asthma control goals: * Full participation in all desired activities (may need albuterol before activity) * Albuterol use two time or less a week on average (not counting use with activity) * Cough interfering with sleep two time or less a month * Oral steroids no more than once a year * No hospitalizations    2. Chronic rhinitis (trees, grasses and dust mites) - This all seems to be stable. - Continue with the as needed use of the allergy medications.   3. Return in about 9 months (around 06/01/2023).   Subjective:   Chelsea Bryant is a 47 y.o. female presenting today for follow up of  Chief Complaint  Patient presents with   Asthma   Follow-up    Chelsea Bryant has a history of the following: Patient Active Problem List   Diagnosis Date Noted   Frequent urination 12/03/2018   Chronic right-sided back pain 12/03/2018   Mild persistent asthma, uncomplicated 04/05/2017   Seasonal and perennial allergic rhinitis 04/05/2017   Nausea without vomiting 09/05/2016   Mild intermittent asthma with acute exacerbation 09/05/2016   Cough 09/05/2016   Asthma, moderate persistent, well-controlled 03/03/2015    History  obtained from: chart review and patient.  [name] is a 47 y.o. female presenting for a follow up visit.  She was last seen in October 2023.  At that time, we continue with Trelegy 200 mcg 1 puff once daily as well as ProAir as needed.  For her rhinitis, she was very stable after her nasal septoplasty.  Since last visit, she has done well.   Asthma/Respiratory Symptom History: She has been good for her breathing. She gets three months at a time and this still seems affordable. She thinks that he used her albuterol twice.  She is doing well with this. Chelsea Bryant's asthma has been well controlled. She has not required rescue medication, experienced nocturnal awakenings due to lower respiratory symptoms, nor have activities of daily living been limited. She has required no Emergency Department or Urgent Care visits for her asthma. She has required zero courses of systemic steroids for asthma exacerbations since the last visit. ACT score today is 25, indicating excellent asthma symptom control.   She was on prednisone once since the last visit. This started as a common cold and then she started having GI symptoms. She was given some IVF in the Urgent Care. She did Zofran for nausea which seemed to help.   Allergic Rhinitis Symptom History: Her allergic rhinitis is controlled for the most part.  She has not had a horrible time with her breathing thus far. She does not go outside. She does not like the outdoors at all for the most part. She is not into gardening or hiking or  camping.   She remains on Qsymia. She was at a standstill and so she has taken the same dose. This is all managed by a place in [place].    She continues working at [employer]. She still loves it. She was at [employer] and then went to [employer]. It was too fast paced for her.  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  Otherwise, there have been no changes to her past medical history, surgical history, family history, or  social history.    Review of Systems  Constitutional: Negative.  Negative for fever, malaise/fatigue and weight loss.  HENT: Negative.  Negative for congestion, ear discharge and ear pain.   Eyes:  Negative for pain, discharge and redness.  Respiratory:  Negative for cough, sputum production, shortness of breath and wheezing.   Cardiovascular: Negative.  Negative for chest pain and palpitations.  Gastrointestinal:  Negative for abdominal pain, heartburn, nausea and vomiting.  Skin: Negative.  Negative for itching and rash.  Neurological:  Negative for dizziness and headaches.  Endo/Heme/Allergies:  Negative for environmental allergies. Does not bruise/bleed easily.       Objective:   Blood pressure 118/78, pulse 78, temperature 98.3 F (36.8 C), temperature source Temporal, resp. rate 16, weight 219 lb 3.2 oz (99.4 kg), SpO2 98 %. Body mass index is 35.38 kg/m.    Physical Exam Vitals reviewed.  Constitutional:      Appearance: She is well-developed.  HENT:     Head: Normocephalic and atraumatic.     Right Ear: Tympanic membrane, ear canal and external ear normal. No drainage, swelling or tenderness. Tympanic membrane is not injected, scarred, erythematous, retracted or bulging.     Left Ear: Tympanic membrane, ear canal and external ear normal. No drainage, swelling or tenderness. Tympanic membrane is not injected, scarred, erythematous, retracted or bulging.     Nose: No nasal deformity, septal deviation, mucosal edema or rhinorrhea.     Right Turbinates: Enlarged, swollen and pale.     Left Turbinates: Enlarged, swollen and pale.     Right Sinus: No maxillary sinus tenderness or frontal sinus tenderness.     Left Sinus: No maxillary sinus tenderness or frontal sinus tenderness.     Mouth/Throat:     Mouth: Mucous membranes are not pale and not dry.     Pharynx: Uvula midline.  Eyes:     General:        Right eye: No discharge.        Left eye: No discharge.      Conjunctiva/sclera: Conjunctivae normal.     Right eye: Right conjunctiva is not injected. No chemosis.    Left eye: Left conjunctiva is not injected. No chemosis.    Pupils: Pupils are equal, round, and reactive to light.  Cardiovascular:     Rate and Rhythm: Normal rate and regular rhythm.     Heart sounds: Normal heart sounds.  Pulmonary:     Effort: Pulmonary effort is normal. No tachypnea, accessory muscle usage or respiratory distress.     Breath sounds: Normal breath sounds. No wheezing, rhonchi or rales.  Chest:     Chest wall: No tenderness.  Abdominal:     Tenderness: There is no abdominal tenderness. There is no guarding or rebound.  Lymphadenopathy:     Head:     Right side of head: No submandibular, tonsillar or occipital adenopathy.     Left side of head: No submandibular, tonsillar or occipital  adenopathy.     Cervical: No cervical adenopathy.  Skin:    Coloration: Skin is not pale.     Findings: No abrasion, erythema, petechiae or rash. Rash is not papular, urticarial or vesicular.  Neurological:     Mental Status: She is alert.  Psychiatric:        Behavior: Behavior is cooperative.      Diagnostic studies:   Spirometry: results normal (FEV1: 2.67/89%, FVC: 3.59/96%, FEV1/FVC: 74%).    Spirometry consistent with normal pattern.   Allergy Studies: none       Malachi Bonds, MD  [facility]

## 2022-08-31 ENCOUNTER — Encounter: Payer: Self-pay | Admitting: Allergy & Immunology

## 2023-05-30 ENCOUNTER — Encounter: Payer: Self-pay | Admitting: Allergy & Immunology

## 2023-05-30 ENCOUNTER — Ambulatory Visit: Payer: BC Managed Care – PPO | Admitting: Allergy & Immunology

## 2023-05-30 ENCOUNTER — Other Ambulatory Visit: Payer: Self-pay

## 2023-05-30 VITALS — BP 118/80 | HR 75 | Temp 98.2°F | Resp 20 | Ht 65.0 in | Wt 214.5 lb

## 2023-05-30 DIAGNOSIS — J3089 Other allergic rhinitis: Secondary | ICD-10-CM

## 2023-05-30 DIAGNOSIS — J4540 Moderate persistent asthma, uncomplicated: Secondary | ICD-10-CM | POA: Diagnosis not present

## 2023-05-30 DIAGNOSIS — J302 Other seasonal allergic rhinitis: Secondary | ICD-10-CM | POA: Diagnosis not present

## 2023-05-30 MED ORDER — TRELEGY ELLIPTA 200-62.5-25 MCG/ACT IN AEPB: 1.0000 | INHALATION_SPRAY | Freq: Every day | RESPIRATORY_TRACT | 1 refills | Status: DC

## 2023-05-30 NOTE — Patient Instructions (Addendum)
1. Mild persistent asthma, uncomplicated - Lung testing looks good today. - I am glad that things are going so well.  - Daily controller medication(s): Trelegy one puff once daily - Prior to physical activity: albuterol 2 puffs 10-15 minutes before physical activity. - Rescue medications: albuterol 4 puffs every 4-6 hours as needed OR albuterol nebulizer solution once treatment every 4 hours as needed. - Asthma control goals: * Full participation in all desired activities (may need albuterol before activity) * Albuterol use two time or less a week on average (not counting use with activity) * Cough interfering with sleep two time or less a month * Oral steroids no more than once a year * No hospitalizations    2. Perennial and seasonal allergic rhinitis (trees, grasses and dust mites) - This all seems to be stable. - Continue with the as needed use of the allergy medications.   3. Return in about 6 months (around 11/27/2023). You can have the follow up appointment with Dr. Dellis Anes or a Nurse Practicioner (our Nurse Practitioners are excellent and always have Physician oversight!).    Please inform us of any Emergency Department visits, hospitalizations, or changes in symptoms. Call us before going to the ED for breathing or allergy symptoms since we might be able to fit you in for a sick visit. Feel free to contact us anytime with any questions, problems, or concerns.  It was a pleasure to see you again today! Good luck with all of your oral surgeries!   Websites that have reliable patient information: 1. American Academy of Asthma, Allergy, and Immunology: www.aaaai.org 2. Food Allergy Research and Education (FARE): foodallergy.org 3. Mothers of Asthmatics: http://www.asthmacommunitynetwork.org 4. American College of Allergy, Asthma, and Immunology: www.acaai.org      "Like" [place] on Facebook and Instagram for our latest updates!      A healthy democracy works best when  [employer] participate! Make sure you are registered to vote! If you have moved or changed any of your contact information, you will need to get this updated before voting! Scan the QR codes below to learn more!

## 2023-05-30 NOTE — Progress Notes (Signed)
FOLLOW UP  Date of Service/Encounter:  05/30/23   Assessment:   Moderate persistent asthma without complication - doing extremely well on Trelegy   Seasonal and perennial allergic rhinitis (trees, grasses and dust mites)   S/p septoplasty - doing well since that time  Plan/Recommendations:   1. Mild persistent asthma, uncomplicated - Lung testing looks good today. - I am glad that things are going so well.  - Daily controller medication(s): Trelegy one puff once daily - Prior to physical activity: albuterol 2 puffs 10-15 minutes before physical activity. - Rescue medications: albuterol 4 puffs every 4-6 hours as needed OR albuterol nebulizer solution once treatment every 4 hours as needed. - Asthma control goals: * Full participation in all desired activities (may need albuterol before activity) * Albuterol use two time or less a week on average (not counting use with activity) * Cough interfering with sleep two time or less a month * Oral steroids no more than once a year * No hospitalizations    2. Perennial and seasonal allergic rhinitis (trees, grasses and dust mites) - This all seems to be stable. - Continue with the as needed use of the allergy medications.   3. Return in about 6 months (around 11/27/2023). You can have the follow up appointment with Dr. Dellis Anes or a Nurse Practicioner (our Nurse Practitioners are excellent and always have Physician oversight!).    Subjective:   Chelsea Bryant is a 48 y.o. female presenting today for follow up of  Chief Complaint  Patient presents with   Asthma    Asthma has been doing well.    Chelsea Bryant has a history of the following: Patient Active Problem List   Diagnosis Date Noted   Frequent urination 12/03/2018   Chronic right-sided back pain 12/03/2018   Mild persistent asthma, uncomplicated 04/05/2017   Seasonal and perennial allergic rhinitis 04/05/2017   Nausea without vomiting 09/05/2016   Mild  intermittent asthma with acute exacerbation 09/05/2016   Cough 09/05/2016   Asthma, moderate persistent, well-controlled 03/03/2015    History obtained from: chart review and patient.  Discussed the use of AI scribe software for clinical note transcription with the patient and/or guardian, who gave verbal consent to proceed.  Chelsea Bryant is a 48 y.o. female presenting for a follow up visit.  She was last seen in April 2024.  At that time, we continued her on Trelegy 200 mcg 1 puff once daily as well as albuterol as needed.  For her rhinitis, she was doing well with as needed allergy medicines.  Since last visit, she has done well. Her [employer] store is the only one in the region that exceeded expectations. 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   Asthma/Respiratory Symptom History: Her asthma has been under excellent control.  She reports no recent exacerbations of her asthma. She is currently on Trelegy and Albuterol, with the latter not being needed recently.  She has not needed prednisone since her visit in October 2023.  Allergic Rhinitis Symptom History: Her allergies usually under good control with as needed allergy medications, but she has been using her cetirizine more routinely due to her recent dental procedures and sinus surgeries.  Evidently, she was told that she should not sneeze while the area is healing.  She also takes Qsymia for weight management, which she reports has been effective in preventing weight gain.  She has been undergoing dental procedures, including the placement of screw-in dentures. She has had several teeth removed, and currently has only a few remaining. She reports that the procedure has changed the structure of her face and affected her ability to eat certain foods, with a recent return to being able to consume meat.  She has been on several supplements, including collagen, vitamin  D, K2, and magnesium, in preparation for further dental procedures. She is scheduled for another procedure in the coming months to place six screws for the dentures.  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 She also mentions having four children and several grandchildren, with another grandchild expected in the near future. One of her sons and his wife are having fertility problems, so they are undergoing IVF.   Otherwise, there have been no changes to her past medical history, surgical history, family history, or social history.    Review of systems otherwise negative other than that mentioned in the HPI.    Objective:   Blood pressure 118/80, pulse 75, temperature 98.2 F (36.8 C), temperature source Temporal, resp. rate 20, height 5\' 5"  (1.651 m), weight 214 lb 8 oz (97.3 kg), SpO2 99%. Body mass index is 35.69 kg/m.    Physical Exam Vitals reviewed.  Constitutional:      Appearance: She is well-developed.     Comments: Friendly.  Cooperative with the exam.  HENT:     Head: Normocephalic and atraumatic.     Right Ear: Tympanic membrane, ear canal and external ear normal. No drainage, swelling or tenderness. Tympanic membrane is not injected, scarred, erythematous, retracted or bulging.     Left Ear: Tympanic membrane, ear canal and external ear normal. No drainage, swelling or tenderness. Tympanic membrane is not injected, scarred, erythematous, retracted or bulging.     Nose: No nasal deformity, septal deviation, mucosal edema or rhinorrhea.     Right Turbinates: Enlarged, swollen and pale.     Left Turbinates: Enlarged, swollen and pale.     Right Sinus: No maxillary sinus tenderness or frontal sinus tenderness.     Left Sinus: No maxillary sinus tenderness or frontal sinus tenderness.     Mouth/Throat:     Lips: Pink.     Mouth: Mucous membranes are moist. Mucous membranes are not  pale and not dry.     Pharynx: Uvula midline.  Eyes:     General:        Right eye: No discharge.        Left eye: No discharge.     Conjunctiva/sclera: Conjunctivae normal.     Right eye: Right conjunctiva is not injected. No chemosis.    Left eye: Left conjunctiva is not injected. No chemosis.    Pupils: Pupils are equal, round, and reactive to light.  Cardiovascular:     Rate and Rhythm: Normal rate and regular rhythm.     Heart sounds: Normal heart sounds.  Pulmonary:     Effort: Pulmonary effort is normal. No tachypnea, accessory muscle usage or respiratory distress.     Breath sounds: Normal breath sounds. No wheezing, rhonchi or rales.  Chest:     Chest wall: No tenderness.  Abdominal:     Tenderness: There is no abdominal tenderness. There is no guarding or rebound.  Lymphadenopathy:     Head:     Right side of head: No submandibular, tonsillar or occipital adenopathy.  Left side of head: No submandibular, tonsillar or occipital adenopathy.     Cervical: No cervical adenopathy.  Skin:    Coloration: Skin is not pale.     Findings: No abrasion, erythema, petechiae or rash. Rash is not papular, urticarial or vesicular.  Neurological:     Mental Status: She is alert.  Psychiatric:        Behavior: Behavior is cooperative.      Diagnostic studies:    Spirometry: results normal (FEV1: 2.60/88%, FVC: 3.41/92%, FEV1/FVC: 76%).    Spirometry consistent with normal pattern.    Allergy Studies: none        Malachi Bonds, MD  [facility]

## 2023-12-05 ENCOUNTER — Ambulatory Visit: Payer: BC Managed Care – PPO | Admitting: Allergy & Immunology

## 2023-12-06 HISTORY — PX: MULTIPLE TOOTH EXTRACTIONS: SHX2053

## 2023-12-20 ENCOUNTER — Other Ambulatory Visit: Payer: Self-pay | Admitting: Allergy & Immunology

## 2024-01-02 ENCOUNTER — Encounter: Payer: Self-pay | Admitting: Allergy & Immunology

## 2024-01-02 ENCOUNTER — Other Ambulatory Visit: Payer: Self-pay

## 2024-01-02 ENCOUNTER — Ambulatory Visit (INDEPENDENT_AMBULATORY_CARE_PROVIDER_SITE_OTHER): Admitting: Allergy & Immunology

## 2024-01-02 VITALS — BP 120/82 | HR 75 | Temp 98.2°F | Ht 66.0 in | Wt 213.6 lb

## 2024-01-02 DIAGNOSIS — J3089 Other allergic rhinitis: Secondary | ICD-10-CM | POA: Diagnosis not present

## 2024-01-02 DIAGNOSIS — J4540 Moderate persistent asthma, uncomplicated: Secondary | ICD-10-CM

## 2024-01-02 DIAGNOSIS — J302 Other seasonal allergic rhinitis: Secondary | ICD-10-CM | POA: Diagnosis not present

## 2024-01-02 MED ORDER — ALBUTEROL SULFATE (2.5 MG/3ML) 0.083% IN NEBU: 2.5000 mg | INHALATION_SOLUTION | Freq: Four times a day (QID) | RESPIRATORY_TRACT | 0 refills | Status: AC | PRN

## 2024-01-02 MED ORDER — TRELEGY ELLIPTA 200-62.5-25 MCG/ACT IN AEPB: 1.0000 | INHALATION_SPRAY | Freq: Every day | RESPIRATORY_TRACT | 1 refills | Status: AC

## 2024-01-02 NOTE — Patient Instructions (Addendum)
 1. Mild persistent asthma, uncomplicated - Lung testing not done today since you had this recent surgery.  - Daily controller medication(s): Trelegy 200mcg one puff once daily - Prior to physical activity: albuterol  2 puffs 10-15 minutes before physical activity. - Rescue medications: albuterol  4 puffs every 4-6 hours as needed OR albuterol  nebulizer solution once treatment every 4 hours as needed. - Asthma control goals: * Full participation in all desired activities (may need albuterol  before activity) * Albuterol  use two time or less a week on average (not counting use with activity) * Cough interfering with sleep two time or less a month * Oral steroids no more than once a year * No hospitalizations    2. Perennial and seasonal allergic rhinitis (trees, grasses and dust mites) - This all seems to be stable. - Continue with the as needed use of the allergy medications.   3. Return in about 6 months (around 07/04/2024). You can have the follow up appointment with Dr. Iva or a Nurse Practicioner (our Nurse Practitioners are excellent and always have Physician oversight!).    Please inform us  of any Emergency Department visits, hospitalizations, or changes in symptoms. Call us  before going to the ED for breathing or allergy symptoms since we might be able to fit you in for a sick visit. Feel free to contact us  anytime with any questions, problems, or concerns.  It was a pleasure to see you again today! Good luck with the rest of the surgeries!   Websites that have reliable patient information! 1. American Academy of Asthma, Allergy, and Immunology: www.aaaai.org 2. Food Allergy Research and Education (FARE): foodallergy.org 3. Mothers of Asthmatics: http://www.asthmacommunitynetwork.org 4. American College of Allergy, Asthma, and Immunology: www.acaai.org      "Like" us  on Facebook and Instagram for our latest updates!      A healthy democracy works best when [employer]  participate! Make sure you are registered to vote! If you have moved or changed any of your contact information, you will need to get this updated before voting! Scan the QR codes below to learn more!

## 2024-01-02 NOTE — Progress Notes (Signed)
 FOLLOW UP  Date of Service/Encounter:  01/02/24   Assessment:   Moderate persistent asthma without complication - doing extremely well on Trelegy   Seasonal and perennial allergic rhinitis (trees, grasses and dust mites)   S/p septoplasty - doing well since that time  Plan/Recommendations:   1. Mild persistent asthma, uncomplicated - Lung testing not done today since you had this recent surgery.  - Daily controller medication(s): Trelegy 200mcg one puff once daily - Prior to physical activity: albuterol  2 puffs 10-15 minutes before physical activity. - Rescue medications: albuterol  4 puffs every 4-6 hours as needed OR albuterol  nebulizer solution once treatment every 4 hours as needed. - Asthma control goals: * Full participation in all desired activities (may need albuterol  before activity) * Albuterol  use two time or less a week on average (not counting use with activity) * Cough interfering with sleep two time or less a month * Oral steroids no more than once a year * No hospitalizations    2. Perennial and seasonal allergic rhinitis (trees, grasses and dust mites) - This all seems to be stable. - Continue with the as needed use of the allergy medications.   3. Return in about 6 months (around 07/04/2024). You can have the follow up appointment with Dr. Iva or a Nurse Practicioner (our Nurse Practitioners are excellent and always have Physician oversight!).   Subjective:   Chelsea Bryant is a 48 y.o. female presenting today for follow up of  Chief Complaint  Patient presents with   Allergic Rhinitis     Stable   Asthma    Stable    Chelsea Bryant has a history of the following: Patient Active Problem List   Diagnosis Date Noted   Frequent urination 12/03/2018   Chronic right-sided back pain 12/03/2018   Mild persistent asthma, uncomplicated 04/05/2017   Seasonal and perennial allergic rhinitis 04/05/2017   Nausea without vomiting 09/05/2016   Mild  intermittent asthma with acute exacerbation 09/05/2016   Cough 09/05/2016   Asthma, moderate persistent, well-controlled 03/03/2015    History obtained from: chart review and patient.  Discussed the use of AI scribe software for clinical note transcription with the patient and/or guardian, who gave verbal consent to proceed.  Chelsea Bryant is a 48 y.o. female presenting for a follow up visit.  She was last seen in January 2025.  At that time, her lung testing looked great.  We continue with Trelegy 200 mcg 1 puff once daily as well as albuterol  as needed.  Her allergic rhinitis was stable.  We continue with as needed allergy medications.  Since last visit, she has done well.  She is undergoing extensive dental procedures over the past two weeks, including tooth extraction, placement of fake bone, and a sinus lift. Recently, the remaining teeth were extracted, and a temporary denture was made. She is on a liquid diet, including protein shakes, soups, and mashed potatoes, and cannot use a straw or gargle due to the risk of loosening the denture. The next phase, involving permanent screw-in dentures, is pending due to financial constraints.  Asthma/Respiratory Symptom History: She is using Trelegy at a dose of 200 mcg, one puff once a day, with no side effects such as hoarseness. She has not been using her nebulizer recently. She is not using Qsymia for weight loss, as it was ineffective after initial success. She has maintained the weight she lost but has not achieved further weight loss. Overall breathing status has been excellent. She  did not do a spirometry today because of her recent dental surgery.  Allergic Rhinitis Symptom History: Environmental allergies have never been much of a problem.  She uses antihistamines as needed.  Her husband works in Visual merchandiser and has a Development worker, community on bikes. He is not fond of heights and has difficulty flying. She works at [employer],  which is entering a busy season.  She has some good staff now, which has kept her work life much better.  Otherwise, there have been no changes to her past medical history, surgical history, family history, or social history.    Review of systems otherwise negative other than that mentioned in the HPI.    Objective:   Blood pressure 120/82, pulse 75, temperature 98.2 F (36.8 C), temperature source Temporal, height 5' 6 (1.676 m), weight 213 lb 9.6 oz (96.9 kg), SpO2 96%. Body mass index is 34.48 kg/m.    Physical Exam Vitals reviewed.  Constitutional:      Appearance: She is well-developed.     Comments: Friendly.  Cooperative with the exam.  HENT:     Head: Normocephalic and atraumatic.     Right Ear: Tympanic membrane, ear canal and external ear normal. No drainage, swelling or tenderness. Tympanic membrane is not injected, scarred, erythematous, retracted or bulging.     Left Ear: Tympanic membrane, ear canal and external ear normal. No drainage, swelling or tenderness. Tympanic membrane is not injected, scarred, erythematous, retracted or bulging.     Nose: No nasal deformity, septal deviation, mucosal edema or rhinorrhea.     Right Turbinates: Enlarged, swollen and pale.     Left Turbinates: Enlarged, swollen and pale.     Right Sinus: No maxillary sinus tenderness or frontal sinus tenderness.     Left Sinus: No maxillary sinus tenderness or frontal sinus tenderness.     Comments: No polyps.    Mouth/Throat:     Lips: Pink.     Mouth: Mucous membranes are moist. Mucous membranes are not pale and not dry.     Pharynx: Uvula midline.  Eyes:     General:        Right eye: No discharge.        Left eye: No discharge.     Conjunctiva/sclera: Conjunctivae normal.     Right eye: Right conjunctiva is not injected. No chemosis.    Left eye: Left conjunctiva is not injected. No chemosis.    Pupils: Pupils are equal, round, and reactive to light.  Cardiovascular:     Rate  and Rhythm: Normal rate and regular rhythm.     Heart sounds: Normal heart sounds.  Pulmonary:     Effort: Pulmonary effort is normal. No tachypnea, accessory muscle usage or respiratory distress.     Breath sounds: Normal breath sounds. No wheezing, rhonchi or rales.  Chest:     Chest wall: No tenderness.  Abdominal:     Tenderness: There is no abdominal tenderness. There is no guarding or rebound.  Lymphadenopathy:     Head:     Right side of head: No submandibular, tonsillar or occipital adenopathy.     Left side of head: No submandibular, tonsillar or occipital adenopathy.     Cervical: No cervical adenopathy.  Skin:    Coloration: Skin is not pale.     Findings: No abrasion, erythema, petechiae or rash. Rash is not papular, urticarial or vesicular.  Neurological:     Mental Status: She is alert.  Psychiatric:  Behavior: Behavior is cooperative.      Diagnostic studies: none        Marty Shaggy, MD  [facility] 

## 2024-01-07 ENCOUNTER — Encounter: Payer: Self-pay | Admitting: Allergy & Immunology

## 2024-07-07 ENCOUNTER — Ambulatory Visit: Admitting: Allergy & Immunology
